# Patient Record
Sex: Female | Born: 1940 | Race: White | Hispanic: No | Smoking: Former smoker
Health system: Southern US, Community
[De-identification: ages and names within clinical notes are randomized; demographics above are authoritative.]

## PROBLEM LIST (undated history)

## (undated) DIAGNOSIS — F419 Anxiety disorder, unspecified: Secondary | ICD-10-CM

## (undated) DIAGNOSIS — J069 Acute upper respiratory infection, unspecified: Secondary | ICD-10-CM

## (undated) DIAGNOSIS — J449 Chronic obstructive pulmonary disease, unspecified: Secondary | ICD-10-CM

## (undated) DIAGNOSIS — R42 Dizziness and giddiness: Secondary | ICD-10-CM

## (undated) DIAGNOSIS — R059 Cough, unspecified: Secondary | ICD-10-CM

## (undated) DIAGNOSIS — E781 Pure hyperglyceridemia: Secondary | ICD-10-CM

## (undated) DIAGNOSIS — I4891 Unspecified atrial fibrillation: Secondary | ICD-10-CM

## (undated) DIAGNOSIS — H35359 Cystoid macular degeneration, unspecified eye: Secondary | ICD-10-CM

## (undated) DIAGNOSIS — R0981 Nasal congestion: Secondary | ICD-10-CM

## (undated) HISTORY — PX: CHOLECYSTECTOMY: SHX55

## (undated) HISTORY — DX: Dizziness and giddiness: R42

## (undated) HISTORY — DX: Pure hyperglyceridemia: E78.1

## (undated) HISTORY — DX: Nasal congestion: R09.81

## (undated) HISTORY — DX: Anxiety disorder, unspecified: F41.9

## (undated) HISTORY — DX: Cough, unspecified: R05.9

## (undated) HISTORY — DX: Acute upper respiratory infection, unspecified: J06.9

## (undated) HISTORY — DX: Cystoid macular degeneration, unspecified eye: H35.359

## (undated) HISTORY — PX: EYE SURGERY: SHX253

## (undated) HISTORY — PX: ABDOMINAL HYSTERECTOMY: SHX81

## (undated) HISTORY — DX: Unspecified atrial fibrillation: I48.91

---

## 2000-03-14 ENCOUNTER — Encounter: Admission: RE | Admit: 2000-03-14 | Discharge: 2000-03-14 | Payer: Self-pay | Admitting: Family Medicine

## 2000-03-14 ENCOUNTER — Encounter: Payer: Self-pay | Admitting: Family Medicine

## 2001-09-15 ENCOUNTER — Encounter: Admission: RE | Admit: 2001-09-15 | Discharge: 2001-09-15 | Payer: Self-pay | Admitting: Family Medicine

## 2001-09-15 ENCOUNTER — Encounter: Payer: Self-pay | Admitting: Family Medicine

## 2002-07-03 ENCOUNTER — Encounter: Admission: RE | Admit: 2002-07-03 | Discharge: 2002-07-03 | Payer: Self-pay | Admitting: Urology

## 2002-07-03 ENCOUNTER — Encounter: Payer: Self-pay | Admitting: Urology

## 2002-09-17 ENCOUNTER — Encounter: Payer: Self-pay | Admitting: Family Medicine

## 2002-09-17 ENCOUNTER — Encounter: Admission: RE | Admit: 2002-09-17 | Discharge: 2002-09-17 | Payer: Self-pay | Admitting: Family Medicine

## 2003-09-19 ENCOUNTER — Encounter: Admission: RE | Admit: 2003-09-19 | Discharge: 2003-09-19 | Payer: Self-pay | Admitting: Family Medicine

## 2005-01-19 ENCOUNTER — Encounter (INDEPENDENT_AMBULATORY_CARE_PROVIDER_SITE_OTHER): Payer: Self-pay | Admitting: Specialist

## 2005-01-20 ENCOUNTER — Ambulatory Visit (HOSPITAL_COMMUNITY): Admission: RE | Admit: 2005-01-20 | Discharge: 2005-01-20 | Payer: Self-pay | Admitting: *Deleted

## 2006-08-24 ENCOUNTER — Encounter: Admission: RE | Admit: 2006-08-24 | Discharge: 2006-08-24 | Payer: Self-pay | Admitting: Family Medicine

## 2007-08-29 ENCOUNTER — Encounter: Admission: RE | Admit: 2007-08-29 | Discharge: 2007-08-29 | Payer: Self-pay | Admitting: Family Medicine

## 2009-03-25 ENCOUNTER — Encounter: Admission: RE | Admit: 2009-03-25 | Discharge: 2009-03-25 | Payer: Self-pay | Admitting: Family Medicine

## 2010-04-22 ENCOUNTER — Encounter: Admission: RE | Admit: 2010-04-22 | Discharge: 2010-04-22 | Payer: Self-pay | Admitting: Family Medicine

## 2011-03-15 ENCOUNTER — Other Ambulatory Visit: Payer: Self-pay | Admitting: Family Medicine

## 2011-03-15 DIAGNOSIS — Z1231 Encounter for screening mammogram for malignant neoplasm of breast: Secondary | ICD-10-CM

## 2011-04-23 ENCOUNTER — Ambulatory Visit
Admission: RE | Admit: 2011-04-23 | Discharge: 2011-04-23 | Disposition: A | Discharge status: Home / Self Care | Payer: Medicare Other | Source: Ambulatory Visit | Attending: Family Medicine | Admitting: Family Medicine

## 2011-04-23 ENCOUNTER — Other Ambulatory Visit: Payer: Self-pay | Admitting: Family Medicine

## 2011-04-23 DIAGNOSIS — Z1231 Encounter for screening mammogram for malignant neoplasm of breast: Secondary | ICD-10-CM

## 2011-09-20 ENCOUNTER — Emergency Department (HOSPITAL_BASED_OUTPATIENT_CLINIC_OR_DEPARTMENT_OTHER)
Admission: EM | Admit: 2011-09-20 | Discharge: 2011-09-20 | Disposition: A | Discharge status: Home / Self Care | Payer: Medicare Other | Attending: Emergency Medicine | Admitting: Emergency Medicine

## 2011-09-20 ENCOUNTER — Other Ambulatory Visit: Payer: Self-pay

## 2011-09-20 ENCOUNTER — Encounter: Payer: Self-pay | Admitting: *Deleted

## 2011-09-20 ENCOUNTER — Emergency Department (INDEPENDENT_AMBULATORY_CARE_PROVIDER_SITE_OTHER): Payer: Medicare Other

## 2011-09-20 DIAGNOSIS — R0602 Shortness of breath: Secondary | ICD-10-CM | POA: Insufficient documentation

## 2011-09-20 DIAGNOSIS — R911 Solitary pulmonary nodule: Secondary | ICD-10-CM

## 2011-09-20 DIAGNOSIS — K219 Gastro-esophageal reflux disease without esophagitis: Secondary | ICD-10-CM | POA: Insufficient documentation

## 2011-09-20 LAB — DIFFERENTIAL
Eosinophils Absolute: 0.1 10*3/uL (ref 0.0–0.7)
Eosinophils Relative: 2 % (ref 0–5)
Lymphocytes Relative: 48 % — ABNORMAL HIGH (ref 12–46)
Lymphs Abs: 3.3 10*3/uL (ref 0.7–4.0)

## 2011-09-20 LAB — CBC
HCT: 37.4 % (ref 36.0–46.0)
Hemoglobin: 12.9 g/dL (ref 12.0–15.0)
MCV: 88 fL (ref 78.0–100.0)
RDW: 13.2 % (ref 11.5–15.5)

## 2011-09-20 LAB — BASIC METABOLIC PANEL
BUN: 16 mg/dL (ref 6–23)
CO2: 28 mEq/L (ref 19–32)
Creatinine, Ser: 0.7 mg/dL (ref 0.50–1.10)
GFR calc Af Amer: >90 mL/min (ref >90)
Sodium: 138 mEq/L (ref 135–145)

## 2011-09-20 LAB — TROPONIN I: Troponin I: <0.3 ng/mL (ref <0.30)

## 2011-09-20 LAB — D-DIMER, QUANTITATIVE: D-Dimer, Quant: 0.58 ug/mL-FEU — ABNORMAL HIGH (ref 0.00–0.48)

## 2011-09-20 MED ORDER — IOHEXOL 350 MG/ML SOLN
80.0000 mL | Freq: Once | INTRAVENOUS | Status: AC | PRN
  Administered 2011-09-20: 80 mL via INTRAVENOUS

## 2011-09-20 MED ORDER — GI COCKTAIL ~~LOC~~
30.0000 mL | Freq: Once | ORAL | Status: AC
  Administered 2011-09-20: 30 mL via ORAL
  Filled 2011-09-20: qty 30

## 2011-09-20 NOTE — Discharge Instructions (Signed)
Diet for GERD or PUD Nutrition therapy can help ease the discomfort of gastroesophageal reflux disease (GERD) and peptic ulcer disease (PUD).  HOME CARE INSTRUCTIONS   Eat your meals slowly, in a relaxed setting.   Eat 5 to 6 small meals per day.   If a food causes distress, stop eating it for a period of time.  FOODS TO AVOID  Coffee, regular or decaffeinated.   Cola beverages, regular or low calorie.   Tea, regular or decaffeinated.   Pepper.   Cocoa.   High fat foods, including meats.   Butter, margarine, hydrogenated oil (trans fats).   Peppermint or spearmint (if you have GERD).   Fruits and vegetables if not tolerated.   Alcohol.   Nicotine (smoking or chewing). This is one of the most potent stimulants to acid production in the gastrointestinal tract.   Any food that seems to aggravate your condition.  If you have questions regarding your diet, ask your caregiver or a registered dietitian. TIPS  Lying flat may make symptoms worse. Keep the head of your bed raised 6 to 9 inches (15 to 23 cm) by using a foam wedge or blocks under the legs of the bed.   Do not lay down until 3 hours after eating a meal.   Daily physical activity may help reduce symptoms.  MAKE SURE YOU:   Understand these instructions.   Will watch your condition.   Will get help right away if you are not doing well or get worse.  Document Released: 09/13/2005 Document Revised: 05/26/2011 Document Reviewed: 01/27/2009 [facility] Patient Information 2012 [place], [place].Gastroesophageal Reflux Disease, Adult Gastroesophageal reflux disease (GERD) happens when acid from your stomach flows up into the esophagus. When acid comes in contact with the esophagus, the acid causes soreness (inflammation) in the esophagus. Over time, GERD may create small holes (ulcers) in the lining of the esophagus. CAUSES   Increased body weight. This puts pressure on the stomach, making acid rise from the stomach into  the esophagus.   Smoking. This increases acid production in the stomach.   Drinking alcohol. This causes decreased pressure in the lower esophageal sphincter (valve or ring of muscle between the esophagus and stomach), allowing acid from the stomach into the esophagus.   Late evening meals and a full stomach. This increases pressure and acid production in the stomach.   A malformed lower esophageal sphincter.  Sometimes, no cause is found. SYMPTOMS   Burning pain in the lower part of the mid-chest behind the breastbone and in the mid-stomach area. This may occur twice a week or more often.   Trouble swallowing.   Sore throat.   Dry cough.   Asthma-like symptoms including chest tightness, shortness of breath, or wheezing.  DIAGNOSIS  Your caregiver may be able to diagnose GERD based on your symptoms. In some cases, X-rays and other tests may be done to check for complications or to check the condition of your stomach and esophagus. TREATMENT  Your caregiver may recommend over-the-counter or prescription medicines to help decrease acid production. Ask your caregiver before starting or adding any new medicines.  HOME CARE INSTRUCTIONS   Change the factors that you can control. Ask your caregiver for guidance concerning weight loss, quitting smoking, and alcohol consumption.   Avoid foods and drinks that make your symptoms worse, such as:   Caffeine or alcoholic drinks.   Chocolate.   Peppermint or mint flavorings.   Garlic and onions.   Spicy foods.  Citrus fruits, such as oranges, lemons, or limes.   Tomato-based foods such as sauce, chili, salsa, and pizza.   Fried and fatty foods.   Avoid lying down for the 3 hours prior to your bedtime or prior to taking a nap.   Eat small, frequent meals instead of large meals.   Wear loose-fitting clothing. Do not wear anything tight around your waist that causes pressure on your stomach.   Raise the head of your bed 6 to 8  inches with wood blocks to help you sleep. Extra pillows will not help.   Only take over-the-counter or prescription medicines for pain, discomfort, or fever as directed by your caregiver.   Do not take aspirin, ibuprofen, or other nonsteroidal anti-inflammatory drugs (NSAIDs).  SEEK IMMEDIATE MEDICAL CARE IF:   You have pain in your arms, neck, jaw, teeth, or back.   Your pain increases or changes in intensity or duration.   You develop nausea, vomiting, or sweating (diaphoresis).   You develop shortness of breath, or you faint.   Your vomit is green, yellow, black, or looks like coffee grounds or blood.   Your stool is red, bloody, or black.  These symptoms could be signs of other problems, such as heart disease, gastric bleeding, or esophageal bleeding. MAKE SURE YOU:   Understand these instructions.   Will watch your condition.   Will get help right away if you are not doing well or get worse.  Document Released: 06/23/2005 Document Revised: 05/26/2011 Document Reviewed: 04/02/2011 [facility] Patient Information 2012 [place], [place].Dyspnea Shortness of breath (dyspnea) is the feeling of uneasy breathing. Dyspnea should be evaluated promptly. DIAGNOSIS  Many tests may be done to find why you are having shortness of breath. Tests may include:  A chest X-ray.   A lung function test.   Blood tests.   Recordings of the electrical activity of the heart (electrocardiogram).   Exercise testing.   Sound wave images of the heart (a cardiac echocardiogram).   A scan.  A cause for your shortness of breath may not be identified initially. In this case, it is important to have a follow-up exam with your caregiver. HOME CARE INSTRUCTIONS   Do not smoke. Smoking is a common cause of shortness of breath. Ask for help to stop smoking.   Avoid being around chemicals that may bother your breathing, such as paint fumes or dust.   Rest as needed. Slowly begin your usual activities.     If medications were prescribed, take them as directed for the full length of time directed. This includes oxygen and any inhaled medications, if prescribed.   It is very important that you follow up with your caregiver or other physician as directed. Waiting to do so or failure to follow up could result in worsening of your condition, possible disability, or death.   Be sure you understand what to do or who to call if your shortness of breath worsens.  SEEK MEDICAL CARE IF:   Your condition does not improve in the time expected.   You have a hard time doing your normal activities even with rest.   You have any side effects from or problems with medications prescribed.  SEEK IMMEDIATE MEDICAL CARE IF:   You feel your shortness of breath is getting worse.   You feel lightheaded, faint or develop a cough not controlled with medications.   You start coughing up blood.   You get pain with breathing.   You get chest pain  or pain in your arms, shoulders or belly (abdomen).   You have a fever.   You are unable to walk up stairs or exercise the way you normally can.  MAKE SURE YOU:   Understand these instructions.   Will watch your condition.   Will get help right away if you are not doing well or get worse.  Document Released: 10/21/2004 Document Revised: 05/26/2011 Document Reviewed: 01/29/2010 [facility] Patient Information 2012 [place], [place].

## 2011-09-20 NOTE — ED Notes (Signed)
Pt reports 2 week hx of SOB states that it has progressively worsened denies chest pain denies lowe rextremity pain or swelling states that she feels like her airway is not large enough

## 2011-09-20 NOTE — ED Notes (Signed)
Pt has no respiratory hx and is very healthy but states that she feels like her airway is to small to breath in. Pt seems to be breathing well and in no respiratory distress. Pt is able to answer questions in complete sentences. Pt states that when she lies down it feels worse and has been going on for a few weeks.

## 2011-09-20 NOTE — ED Provider Notes (Addendum)
History   This chart was scribed for Cyndra Numbers, MD by Melba Coon. The patient was seen in room MH07/MH07 and the patient's care was started at 9:55PM.    CSN: [id number]  Arrival date & time 09/20/11  2127   First MD Initiated Contact with Patient 09/20/11 2146      Chief Complaint  Patient presents with  . Shortness of Breath    (Consider location/radiation/quality/duration/timing/severity/associated sxs/prior treatment) HPI  Nancy Avila is a 70 y.o. female who presents to the Emergency Department complaining of shortness of breath with an onset 3 days ago. Pt states that SOB has been "gradually progressing" for a couple of weeks, but recently it has been at its worst. She has never had SOB before or any respiratory problems. She has no medical problems as well (last physical was 7 months ago). No problems with ingestion, but problems with acid reflux last year. Since then, episodes of reflux have not been common but still are present periodically. Pt has smoked for 40 yrs but stopped 2 months ago. Nothing alleviates the SOB, but exertion aggravates it. She has not recently taken any long trips and is not currently under hormone therapy. No swelling, cough, fever, dark tarry stools, chest pain, or any pain in general.  No PCP  History reviewed. No pertinent past medical history.  Past Surgical History  Procedure Date  . Abdominal hysterectomy   . Cholecystectomy   . Eye surgery    Family history of blood clots (dad, sister)  History  Substance Use Topics  . Smoking status: Never Smoker   . Smokeless tobacco: Not on file  . Alcohol Use: No    OB History    Grav Para Term Preterm Abortions TAB SAB Ect Mult Living                  Review of Systems 10 Systems reviewed and are negative for acute change except as noted in the HPI.  Allergies  Review of patient's allergies indicates no known allergies.  Home Medications   Current Outpatient Rx  Name Route  Sig Dispense Refill  . CALCIUM CARBONATE-VITAMIN D 600-400 MG-UNIT PO TABS Oral Take 1 tablet by mouth 2 (two) times daily.      Marland Kitchen CINNAMON 500 MG PO TABS Oral Take 1 tablet by mouth 2 (two) times daily.      . CYCLOSPORINE 0.05 % OP EMUL Both Eyes Place 1 drop into both eyes 2 (two) times daily.      . OMEGA-3 FATTY ACIDS 1000 MG PO CAPS Oral Take 1 g by mouth 2 (two) times daily.      Marland Kitchen GARLIC 100 MG PO TABS Oral Take 1 tablet by mouth 2 (two) times daily.      Marland Kitchen PHENYLEPHRINE HCL 10 MG PO TABS Oral Take 10 mg by mouth 2 (two) times daily.      . RED YEAST RICE 600 MG PO CAPS Oral Take 1 capsule by mouth 2 (two) times daily.      Marland Kitchen VITAMIN C 500 MG PO TABS Oral Take 500 mg by mouth 2 (two) times daily.      Marland Kitchen ZINC 100 MG PO TABS Oral Take 1 tablet by mouth daily.        BP 138/88  Pulse 74  Temp(Src) 98.2 F (36.8 C) (Oral)  Resp 20  SpO2 99%  Physical Exam  Nursing note and vitals reviewed. Constitutional: She is oriented to person, place, and time. She  appears well-developed and well-nourished. No distress.  HENT:  Head: Normocephalic and atraumatic.  Eyes: Conjunctivae and EOM are normal. Pupils are equal, round, and reactive to light.  Neck: Normal range of motion. No tracheal deviation present.  Cardiovascular: Normal rate, regular rhythm and normal heart sounds.  Exam reveals no gallop and no friction rub.   No murmur heard. Pulmonary/Chest: Effort normal. No respiratory distress. She has no wheezes. She has no rales.       Lungs sounds are clear.  Abdominal: Soft. Bowel sounds are normal. She exhibits no distension. There is no tenderness. There is no rebound and no guarding.  Musculoskeletal: Normal range of motion. She exhibits no edema.  Neurological: She is alert and oriented to person, place, and time. No sensory deficit.  Skin: Skin is warm and dry. No rash noted.  Psychiatric: She has a normal mood and affect. Her behavior is normal.    ED Course  Procedures  (including critical care time)   Date: 09/20/2011  Rate: 73  Rhythm: normal sinus rhythm and sinus arrhythmia  QRS Axis: normal  Intervals: normal  ST/T Wave abnormalities: nonspecific T wave changes  Conduction Disutrbances:incomplete RBBB  Narrative Interpretation:   Old EKG Reviewed: none available   DIAGNOSTIC STUDIES: Oxygen Saturation is 99% on room air, normal by my interpretation.    COORDINATION OF CARE:  10:39PM - imaging results returned; EDMD determined no anemia, possible inhaler treatment 10:46PM - D-dimer is positive; Dr elects for CAT scan   Labs Reviewed  DIFFERENTIAL - Abnormal; Notable for the following:    Neutrophils Relative 37 (*)    Lymphocytes Relative 48 (*)    All other components within normal limits  BASIC METABOLIC PANEL - Abnormal; Notable for the following:    Glucose, Bld 107 (*)    GFR calc non Af Amer 86 (*)    All other components within normal limits  D-DIMER, QUANTITATIVE - Abnormal; Notable for the following:    D-Dimer, Quant 0.58 (*)    All other components within normal limits  CBC  TROPONIN I  PRO B NATRIURETIC PEPTIDE   Dg Chest 2 View  09/20/2011  *RADIOLOGY REPORT*  Clinical Data: Short of breath.  CHEST - 2 VIEW  Comparison: 03/13/2008.  Findings: Hyperinflation.  Chronic bronchitic changes are present at the lung bases.  No airspace disease.  Cardiopericardial silhouette is within normal limits.  Bilateral pleural apical scarring.  Aortic arch atherosclerosis. Bosselation of the right hemidiaphragm.  IMPRESSION: Hyperinflation suggesting emphysema.  Mild basilar chronic bronchitic changes without acute cardiopulmonary disease.  Original Report Authenticated By: Andreas Newport, M.D.     1. Shortness of breath   2. GERD (gastroesophageal reflux disease)       MDM  Patient was evaluated and was hemodynamically stable. Based on her complaints she did have workup for shortness of breath. Chest x-ray showed chronic  bronchitic changes with no acute cardiopulmonary process. EKG showed incomplete right bundle-branch block but was otherwise unremarkable. Troponin, CBC, and renal panel as well as BNP were within normal limits. Patient was given a GI cocktail given recent complaints of reflux and this did not improve her symptoms. She declined albuterol inhaler when this was offered after chest x-ray returned. D-dimer was positive and CT images of the chest was ordered.  This showed a pulmonary nodule as well as a small area concerning for atelectasis versus infarction. I spoke to the radiologist myself and given that patient does not have chest pain this is  likely just a small area of atelectasis. Patient did have improvement and resolution of her symptoms with GI cocktail. We discussed that her symptoms may be related to reflux. Education was provided. Patient preferred not to be started on a medication this evening. She can discuss this with her primary care physician. She was discharged in good condition.  I personally performed the services described in this documentation, which was scribed in my presence. The recorded information has been reviewed and considered.         Cyndra Numbers, MD 09/20/11 4540  Cyndra Numbers, MD 09/20/11 [phone]

## 2012-03-13 ENCOUNTER — Other Ambulatory Visit: Payer: Self-pay | Admitting: Family Medicine

## 2012-03-13 DIAGNOSIS — R911 Solitary pulmonary nodule: Secondary | ICD-10-CM

## 2012-03-16 ENCOUNTER — Ambulatory Visit
Admission: RE | Admit: 2012-03-16 | Discharge: 2012-03-16 | Disposition: A | Discharge status: Home / Self Care | Payer: Medicare Other | Source: Ambulatory Visit | Attending: Family Medicine | Admitting: Family Medicine

## 2012-03-16 DIAGNOSIS — R911 Solitary pulmonary nodule: Secondary | ICD-10-CM

## 2012-03-16 MED ORDER — IOHEXOL 300 MG/ML  SOLN
75.0000 mL | Freq: Once | INTRAMUSCULAR | Status: AC | PRN
  Administered 2012-03-16: 75 mL via INTRAVENOUS

## 2012-05-01 ENCOUNTER — Other Ambulatory Visit: Payer: Self-pay | Admitting: Family Medicine

## 2012-05-01 DIAGNOSIS — Z1231 Encounter for screening mammogram for malignant neoplasm of breast: Secondary | ICD-10-CM

## 2012-05-08 ENCOUNTER — Ambulatory Visit
Admission: RE | Admit: 2012-05-08 | Discharge: 2012-05-08 | Disposition: A | Discharge status: Home / Self Care | Payer: Medicare Other | Source: Ambulatory Visit | Attending: Family Medicine | Admitting: Family Medicine

## 2012-05-08 DIAGNOSIS — Z1231 Encounter for screening mammogram for malignant neoplasm of breast: Secondary | ICD-10-CM

## 2013-04-09 ENCOUNTER — Other Ambulatory Visit: Payer: Self-pay

## 2013-04-09 DIAGNOSIS — Z1231 Encounter for screening mammogram for malignant neoplasm of breast: Secondary | ICD-10-CM

## 2013-05-09 ENCOUNTER — Ambulatory Visit
Admission: RE | Admit: 2013-05-09 | Discharge: 2013-05-09 | Disposition: A | Discharge status: Home / Self Care | Payer: Medicare Other | Source: Ambulatory Visit

## 2013-05-09 DIAGNOSIS — Z1231 Encounter for screening mammogram for malignant neoplasm of breast: Secondary | ICD-10-CM

## 2013-08-02 ENCOUNTER — Emergency Department (HOSPITAL_BASED_OUTPATIENT_CLINIC_OR_DEPARTMENT_OTHER): Payer: Medicare Other

## 2013-08-02 ENCOUNTER — Emergency Department (HOSPITAL_BASED_OUTPATIENT_CLINIC_OR_DEPARTMENT_OTHER)
Admission: EM | Admit: 2013-08-02 | Discharge: 2013-08-02 | Disposition: A | Discharge status: Home / Self Care | Payer: Medicare Other | Attending: Emergency Medicine | Admitting: Emergency Medicine

## 2013-08-02 ENCOUNTER — Encounter (HOSPITAL_BASED_OUTPATIENT_CLINIC_OR_DEPARTMENT_OTHER): Payer: Self-pay | Admitting: Emergency Medicine

## 2013-08-02 DIAGNOSIS — Z79899 Other long term (current) drug therapy: Secondary | ICD-10-CM | POA: Insufficient documentation

## 2013-08-02 DIAGNOSIS — J441 Chronic obstructive pulmonary disease with (acute) exacerbation: Secondary | ICD-10-CM | POA: Insufficient documentation

## 2013-08-02 DIAGNOSIS — H5500 Unspecified nystagmus: Secondary | ICD-10-CM | POA: Insufficient documentation

## 2013-08-02 DIAGNOSIS — R42 Dizziness and giddiness: Secondary | ICD-10-CM | POA: Insufficient documentation

## 2013-08-02 HISTORY — DX: Chronic obstructive pulmonary disease, unspecified: J44.9

## 2013-08-02 LAB — URINALYSIS, ROUTINE W REFLEX MICROSCOPIC
Bilirubin Urine: NEGATIVE
Hgb urine dipstick: NEGATIVE
Ketones, ur: NEGATIVE mg/dL
Protein, ur: NEGATIVE mg/dL
Urobilinogen, UA: 0.2 mg/dL (ref 0.0–1.0)

## 2013-08-02 LAB — CBC
MCH: 30.4 pg (ref 26.0–34.0)
MCHC: 34.1 g/dL (ref 30.0–36.0)
MCV: 89.1 fL (ref 78.0–100.0)
Platelets: 164 10*3/uL (ref 150–400)
RDW: 13.5 % (ref 11.5–15.5)
WBC: 5.6 10*3/uL (ref 4.0–10.5)

## 2013-08-02 LAB — COMPREHENSIVE METABOLIC PANEL
AST: 25 U/L (ref 0–37)
Albumin: 4 g/dL (ref 3.5–5.2)
Calcium: 9.6 mg/dL (ref 8.4–10.5)
Creatinine, Ser: 0.8 mg/dL (ref 0.50–1.10)

## 2013-08-02 LAB — TROPONIN I: Troponin I: <0.3 ng/mL (ref <0.30)

## 2013-08-02 MED ORDER — MECLIZINE HCL 25 MG PO TABS
12.5000 mg | ORAL_TABLET | Freq: Once | ORAL | Status: AC
  Administered 2013-08-02: 12.5 mg via ORAL

## 2013-08-02 MED ORDER — MECLIZINE HCL 12.5 MG PO TABS: 12.5000 mg | ORAL_TABLET | Freq: Three times a day (TID) | ORAL | Status: DC | PRN

## 2013-08-02 MED ORDER — MECLIZINE HCL 25 MG PO TABS
ORAL_TABLET | ORAL | Status: AC
  Filled 2013-08-02: qty 1

## 2013-08-02 NOTE — ED Notes (Signed)
Dizziness onset this am denies pain

## 2013-08-02 NOTE — ED Provider Notes (Signed)
CSN: [id number]     Arrival date & time 08/02/13  1116 History   First MD Initiated Contact with Patient 08/02/13 1126     Chief Complaint  Patient presents with  . Dizziness   (Consider location/radiation/quality/duration/timing/severity/associated sxs/prior Treatment) HPI  72 y/o female here with acute onset dizziness this am at 8:05 (4 hours 10 minutes ago). She states that she was shopping at [place] and her dizziness began. She denies falling, LOC, weakness, numbness, confusion, or difficulty talking. Her dizziness is described as an unsteady feeling, she denies any spinning sensation. She is having stable dyspnea due to her COPD but new R sided chest heaviness. She denies recent illness and has a normal appetite and PO intake.   Past Medical History  Diagnosis Date  . COPD (chronic obstructive pulmonary disease)    Past Surgical History  Procedure Laterality Date  . Abdominal hysterectomy    . Cholecystectomy    . Eye surgery     History reviewed. No pertinent family history. History  Substance Use Topics  . Smoking status: Never Smoker   . Smokeless tobacco: Not on file  . Alcohol Use: No   OB History   Grav Para Term Preterm Abortions TAB SAB Ect Mult Living                 Review of Systems  Constitutional: Negative for fever, chills, activity change and appetite change.  HENT: Negative for congestion and sore throat.   Eyes: Negative for visual disturbance.  Respiratory: Positive for shortness of breath. Negative for chest tightness.        R sided Chest heaviness  Gastrointestinal: Negative for nausea, vomiting, abdominal pain, diarrhea and constipation.  Genitourinary: Negative for dysuria and flank pain.  Musculoskeletal: Negative for back pain.  Neurological: Positive for dizziness. Negative for seizures, syncope, facial asymmetry, weakness, numbness and headaches.    Allergies  Review of patient's allergies indicates no known allergies.  Home Medications    Current Outpatient Rx  Name  Route  Sig  Dispense  Refill  . Calcium Carbonate-Vitamin D (CALTRATE 600+D) 600-400 MG-UNIT per tablet   Oral   Take 1 tablet by mouth 2 (two) times daily.           . Cinnamon 500 MG TABS   Oral   Take 1 tablet by mouth 2 (two) times daily.           . cycloSPORINE (RESTASIS) 0.05 % ophthalmic emulsion   Both Eyes   Place 1 drop into both eyes 2 (two) times daily.           . fish oil-omega-3 fatty acids 1000 MG capsule   Oral   Take 1 g by mouth 2 (two) times daily.           . Garlic 100 MG TABS   Oral   Take 1 tablet by mouth 2 (two) times daily.           . meclizine (ANTIVERT) 12.5 MG tablet   Oral   Take 1 tablet (12.5 mg total) by mouth 3 (three) times daily as needed for dizziness.   30 tablet   0   . phenylephrine (SUDAFED PE) 10 MG TABS   Oral   Take 10 mg by mouth 2 (two) times daily.           . Red Yeast Rice 600 MG CAPS   Oral   Take 1 capsule by mouth 2 (two) times daily.           Marland Kitchen  vitamin C (ASCORBIC ACID) 500 MG tablet   Oral   Take 500 mg by mouth 2 (two) times daily.           . Zinc 100 MG TABS   Oral   Take 1 tablet by mouth daily.            BP 124/85  Pulse 68  Temp(Src) 98.5 F (36.9 C) (Oral)  Resp 16  Ht 5' 8.5" (1.74 m)  Wt 165 lb (74.844 kg)  BMI 24.72 kg/m2  SpO2 96% Physical Exam  Constitutional: She is oriented to person, place, and time. She appears well-developed and well-nourished. No distress.  HENT:  Head: Normocephalic and atraumatic.  Right Ear: External ear normal.  Left Ear: Tympanic membrane and external ear normal.  R Tm with tear drop shaped clearing of TM  Eyes: Right eye exhibits nystagmus. Left eye exhibits no nystagmus.  Neck: Neck supple.  Cardiovascular: Normal rate, regular rhythm and normal heart sounds.   No murmur heard. Pulmonary/Chest: Effort normal and breath sounds normal. She has no wheezes.  Abdominal: Soft. Bowel sounds are normal. There  is no tenderness.  Musculoskeletal: She exhibits no edema.  Neurological: She is alert and oriented to person, place, and time. She has normal strength. No cranial nerve deficit or sensory deficit.  Positive romberg Few beats of R beating nystagmus   Skin: Skin is warm and dry.  Psychiatric: She has a normal mood and affect.    ED Course  Procedures (including critical care time) Labs Review Labs Reviewed  URINALYSIS, ROUTINE W REFLEX MICROSCOPIC - Abnormal; Notable for the following:    Specific Gravity, Urine 1.003 (*)    All other components within normal limits  COMPREHENSIVE METABOLIC PANEL - Abnormal; Notable for the following:    Glucose, Bld 104 (*)    GFR calc non Af Amer 73 (*)    GFR calc Af Amer 85 (*)    All other components within normal limits  CBC  TROPONIN I   Imaging Review Dg Chest 2 View  08/02/2013   CLINICAL DATA:  Dizziness. COPD. History of cholecystectomy.  EXAM: CHEST  2 VIEW  COMPARISON:  03/16/2012.  FINDINGS: Stable appearance of the chest. Cardiopericardial silhouette is within normal limits. Bosselation of the right hemidiaphragm. Mild hyperinflation compatible with emphysema. The pulmonary nodule seen on prior chest CT are below the limits of radiographic resolution. No mass lesion is identified. No airspace disease or pleural effusion. Aortic arch atherosclerosis.  IMPRESSION: No interval change or acute abnormality. Hyperinflation consistent with emphysema.   Electronically Signed   By: Andreas Newport M.D.   On: 08/02/2013 13:12    EKG Interpretation     Ventricular Rate:  70 PR Interval:  188 QRS Duration: 92 QT Interval:  408 QTC Calculation: 440 R Axis:   65 Text Interpretation:  Normal sinus rhythm Normal ECG No significant change was found            MDM   1. Vertigo    Dizziness consistent with peripheral vertigo, improvement with meclizine, able to walk with minimal symptoms prior to dc Unlikely CVA with normal neuro  exam.  UA, CMP, and CBC WNL CXR hyperinflated cw CO/PD Troponin Negative and EKG without ischemic changes  Will DC home, Advise close PCP f/u. Return for worsening symptoms  Murtis Sink, MD [facility] Family Medicine Resident, PGY-2 08/02/2013, 3:43 PM       Elenora Gamma, MD 08/02/13 [phone]

## 2013-08-02 NOTE — ED Provider Notes (Signed)
I saw and evaluated the patient, reviewed the resident's note and I agree with the findings and plan.  EKG Interpretation     Ventricular Rate:  70 PR Interval:  188 QRS Duration: 92 QT Interval:  408 QTC Calculation: 440 R Axis:   65 Text Interpretation:  Normal sinus rhythm Normal ECG No significant change was found           72 yo female with sudden onset dizziness.  Symptoms and exam consistent with peripheral vertigo.  On exam, CN II-XII intact, no nystagmus, normal coordination, no truncal ataxia, normal gait without ataxia, normal strength and sensation.  No signs or symptoms concerning for central vertigo.    Clinical Impression: 1. Vertigo       Candyce Churn, MD 08/02/13 1534

## 2013-08-03 NOTE — ED Provider Notes (Signed)
I saw and evaluated the patient, reviewed the resident's note and I agree with the findings and plan.  EKG Interpretation     Ventricular Rate:  70 PR Interval:  188 QRS Duration: 92 QT Interval:  408 QTC Calculation: 440 R Axis:   65 Text Interpretation:  Normal sinus rhythm Normal ECG No significant change was found              Candyce Churn, MD 08/03/13 [phone]

## 2013-09-21 ENCOUNTER — Other Ambulatory Visit: Payer: Self-pay | Admitting: Family Medicine

## 2013-09-21 DIAGNOSIS — R911 Solitary pulmonary nodule: Secondary | ICD-10-CM

## 2013-09-25 ENCOUNTER — Ambulatory Visit
Admission: RE | Admit: 2013-09-25 | Discharge: 2013-09-25 | Disposition: A | Discharge status: Home / Self Care | Payer: Medicare Other | Source: Ambulatory Visit | Attending: Family Medicine | Admitting: Family Medicine

## 2013-09-25 DIAGNOSIS — R911 Solitary pulmonary nodule: Secondary | ICD-10-CM

## 2013-09-25 MED ORDER — IOHEXOL 300 MG/ML  SOLN
75.0000 mL | Freq: Once | INTRAMUSCULAR | Status: AC | PRN
  Administered 2013-09-25: 75 mL via INTRAVENOUS

## 2014-04-15 ENCOUNTER — Other Ambulatory Visit: Payer: Self-pay

## 2014-04-15 DIAGNOSIS — Z1231 Encounter for screening mammogram for malignant neoplasm of breast: Secondary | ICD-10-CM

## 2014-05-10 ENCOUNTER — Encounter (INDEPENDENT_AMBULATORY_CARE_PROVIDER_SITE_OTHER): Payer: Self-pay

## 2014-05-10 ENCOUNTER — Ambulatory Visit
Admission: RE | Admit: 2014-05-10 | Discharge: 2014-05-10 | Disposition: A | Discharge status: Home / Self Care | Payer: Medicare HMO | Source: Ambulatory Visit

## 2014-05-10 DIAGNOSIS — Z1231 Encounter for screening mammogram for malignant neoplasm of breast: Secondary | ICD-10-CM

## 2015-04-07 ENCOUNTER — Other Ambulatory Visit: Payer: Self-pay

## 2015-04-07 DIAGNOSIS — Z1231 Encounter for screening mammogram for malignant neoplasm of breast: Secondary | ICD-10-CM

## 2015-05-12 ENCOUNTER — Ambulatory Visit
Admission: RE | Admit: 2015-05-12 | Discharge: 2015-05-12 | Disposition: A | Discharge status: Home / Self Care | Payer: Medicare HMO | Source: Ambulatory Visit

## 2015-05-12 DIAGNOSIS — Z1231 Encounter for screening mammogram for malignant neoplasm of breast: Secondary | ICD-10-CM

## 2015-10-30 DIAGNOSIS — L309 Dermatitis, unspecified: Secondary | ICD-10-CM | POA: Diagnosis not present

## 2015-12-01 DIAGNOSIS — F419 Anxiety disorder, unspecified: Secondary | ICD-10-CM | POA: Insufficient documentation

## 2015-12-01 DIAGNOSIS — R911 Solitary pulmonary nodule: Secondary | ICD-10-CM | POA: Insufficient documentation

## 2015-12-01 DIAGNOSIS — E785 Hyperlipidemia, unspecified: Secondary | ICD-10-CM | POA: Insufficient documentation

## 2015-12-01 DIAGNOSIS — K219 Gastro-esophageal reflux disease without esophagitis: Secondary | ICD-10-CM | POA: Insufficient documentation

## 2015-12-01 DIAGNOSIS — L03115 Cellulitis of right lower limb: Secondary | ICD-10-CM | POA: Diagnosis not present

## 2015-12-15 DIAGNOSIS — L03115 Cellulitis of right lower limb: Secondary | ICD-10-CM | POA: Diagnosis not present

## 2016-04-14 ENCOUNTER — Other Ambulatory Visit: Payer: Self-pay | Admitting: Family Medicine

## 2016-04-14 DIAGNOSIS — Z1231 Encounter for screening mammogram for malignant neoplasm of breast: Secondary | ICD-10-CM

## 2016-05-13 ENCOUNTER — Ambulatory Visit
Admission: RE | Admit: 2016-05-13 | Discharge: 2016-05-13 | Disposition: A | Discharge status: Home / Self Care | Payer: PPO | Source: Ambulatory Visit | Attending: Family Medicine | Admitting: Family Medicine

## 2016-05-13 DIAGNOSIS — Z1231 Encounter for screening mammogram for malignant neoplasm of breast: Secondary | ICD-10-CM | POA: Diagnosis not present

## 2016-09-16 DIAGNOSIS — R05 Cough: Secondary | ICD-10-CM | POA: Diagnosis not present

## 2016-09-16 DIAGNOSIS — J019 Acute sinusitis, unspecified: Secondary | ICD-10-CM | POA: Diagnosis not present

## 2016-09-24 DIAGNOSIS — J40 Bronchitis, not specified as acute or chronic: Secondary | ICD-10-CM | POA: Diagnosis not present

## 2016-09-24 DIAGNOSIS — K1120 Sialoadenitis, unspecified: Secondary | ICD-10-CM | POA: Diagnosis not present

## 2017-04-01 ENCOUNTER — Other Ambulatory Visit: Payer: Self-pay | Admitting: Family Medicine

## 2017-04-01 DIAGNOSIS — Z1231 Encounter for screening mammogram for malignant neoplasm of breast: Secondary | ICD-10-CM

## 2017-05-16 ENCOUNTER — Ambulatory Visit
Admission: RE | Admit: 2017-05-16 | Discharge: 2017-05-16 | Disposition: A | Discharge status: Home / Self Care | Payer: PPO | Source: Ambulatory Visit | Attending: Family Medicine | Admitting: Family Medicine

## 2017-05-16 DIAGNOSIS — Z1231 Encounter for screening mammogram for malignant neoplasm of breast: Secondary | ICD-10-CM

## 2017-08-30 DIAGNOSIS — M9905 Segmental and somatic dysfunction of pelvic region: Secondary | ICD-10-CM | POA: Diagnosis not present

## 2017-08-30 DIAGNOSIS — M9904 Segmental and somatic dysfunction of sacral region: Secondary | ICD-10-CM | POA: Diagnosis not present

## 2017-08-30 DIAGNOSIS — M25551 Pain in right hip: Secondary | ICD-10-CM | POA: Diagnosis not present

## 2017-08-30 DIAGNOSIS — M62830 Muscle spasm of back: Secondary | ICD-10-CM | POA: Diagnosis not present

## 2017-08-30 DIAGNOSIS — M9903 Segmental and somatic dysfunction of lumbar region: Secondary | ICD-10-CM | POA: Diagnosis not present

## 2017-09-01 DIAGNOSIS — M25551 Pain in right hip: Secondary | ICD-10-CM | POA: Diagnosis not present

## 2017-09-01 DIAGNOSIS — M9903 Segmental and somatic dysfunction of lumbar region: Secondary | ICD-10-CM | POA: Diagnosis not present

## 2017-09-01 DIAGNOSIS — M62830 Muscle spasm of back: Secondary | ICD-10-CM | POA: Diagnosis not present

## 2017-09-01 DIAGNOSIS — M9905 Segmental and somatic dysfunction of pelvic region: Secondary | ICD-10-CM | POA: Diagnosis not present

## 2017-09-01 DIAGNOSIS — M9904 Segmental and somatic dysfunction of sacral region: Secondary | ICD-10-CM | POA: Diagnosis not present

## 2017-09-08 DIAGNOSIS — M62830 Muscle spasm of back: Secondary | ICD-10-CM | POA: Diagnosis not present

## 2017-09-08 DIAGNOSIS — M25551 Pain in right hip: Secondary | ICD-10-CM | POA: Diagnosis not present

## 2017-09-08 DIAGNOSIS — M9905 Segmental and somatic dysfunction of pelvic region: Secondary | ICD-10-CM | POA: Diagnosis not present

## 2017-09-08 DIAGNOSIS — M9903 Segmental and somatic dysfunction of lumbar region: Secondary | ICD-10-CM | POA: Diagnosis not present

## 2017-09-08 DIAGNOSIS — M9904 Segmental and somatic dysfunction of sacral region: Secondary | ICD-10-CM | POA: Diagnosis not present

## 2017-09-14 DIAGNOSIS — M25551 Pain in right hip: Secondary | ICD-10-CM | POA: Diagnosis not present

## 2017-09-14 DIAGNOSIS — M9905 Segmental and somatic dysfunction of pelvic region: Secondary | ICD-10-CM | POA: Diagnosis not present

## 2017-09-14 DIAGNOSIS — M62830 Muscle spasm of back: Secondary | ICD-10-CM | POA: Diagnosis not present

## 2017-09-14 DIAGNOSIS — M9903 Segmental and somatic dysfunction of lumbar region: Secondary | ICD-10-CM | POA: Diagnosis not present

## 2017-09-14 DIAGNOSIS — M9904 Segmental and somatic dysfunction of sacral region: Secondary | ICD-10-CM | POA: Diagnosis not present

## 2017-09-29 DIAGNOSIS — M25551 Pain in right hip: Secondary | ICD-10-CM | POA: Diagnosis not present

## 2017-09-29 DIAGNOSIS — M62830 Muscle spasm of back: Secondary | ICD-10-CM | POA: Diagnosis not present

## 2017-09-29 DIAGNOSIS — M9904 Segmental and somatic dysfunction of sacral region: Secondary | ICD-10-CM | POA: Diagnosis not present

## 2017-09-29 DIAGNOSIS — M9905 Segmental and somatic dysfunction of pelvic region: Secondary | ICD-10-CM | POA: Diagnosis not present

## 2017-09-29 DIAGNOSIS — M9903 Segmental and somatic dysfunction of lumbar region: Secondary | ICD-10-CM | POA: Diagnosis not present

## 2017-10-05 DIAGNOSIS — M9903 Segmental and somatic dysfunction of lumbar region: Secondary | ICD-10-CM | POA: Diagnosis not present

## 2017-10-05 DIAGNOSIS — M25551 Pain in right hip: Secondary | ICD-10-CM | POA: Diagnosis not present

## 2017-10-05 DIAGNOSIS — M9904 Segmental and somatic dysfunction of sacral region: Secondary | ICD-10-CM | POA: Diagnosis not present

## 2017-10-05 DIAGNOSIS — M62830 Muscle spasm of back: Secondary | ICD-10-CM | POA: Diagnosis not present

## 2017-10-05 DIAGNOSIS — M9905 Segmental and somatic dysfunction of pelvic region: Secondary | ICD-10-CM | POA: Diagnosis not present

## 2017-10-12 DIAGNOSIS — M5417 Radiculopathy, lumbosacral region: Secondary | ICD-10-CM | POA: Diagnosis not present

## 2017-10-12 DIAGNOSIS — M9903 Segmental and somatic dysfunction of lumbar region: Secondary | ICD-10-CM | POA: Diagnosis not present

## 2017-10-12 DIAGNOSIS — M5136 Other intervertebral disc degeneration, lumbar region: Secondary | ICD-10-CM | POA: Diagnosis not present

## 2017-10-12 DIAGNOSIS — M9905 Segmental and somatic dysfunction of pelvic region: Secondary | ICD-10-CM | POA: Diagnosis not present

## 2017-10-19 DIAGNOSIS — M9903 Segmental and somatic dysfunction of lumbar region: Secondary | ICD-10-CM | POA: Diagnosis not present

## 2017-10-19 DIAGNOSIS — M5417 Radiculopathy, lumbosacral region: Secondary | ICD-10-CM | POA: Diagnosis not present

## 2017-10-19 DIAGNOSIS — M5136 Other intervertebral disc degeneration, lumbar region: Secondary | ICD-10-CM | POA: Diagnosis not present

## 2017-10-19 DIAGNOSIS — M9905 Segmental and somatic dysfunction of pelvic region: Secondary | ICD-10-CM | POA: Diagnosis not present

## 2017-11-01 DIAGNOSIS — M9903 Segmental and somatic dysfunction of lumbar region: Secondary | ICD-10-CM | POA: Diagnosis not present

## 2017-11-01 DIAGNOSIS — M5417 Radiculopathy, lumbosacral region: Secondary | ICD-10-CM | POA: Diagnosis not present

## 2017-11-01 DIAGNOSIS — M5136 Other intervertebral disc degeneration, lumbar region: Secondary | ICD-10-CM | POA: Diagnosis not present

## 2017-11-01 DIAGNOSIS — M9905 Segmental and somatic dysfunction of pelvic region: Secondary | ICD-10-CM | POA: Diagnosis not present

## 2017-12-07 DIAGNOSIS — M9903 Segmental and somatic dysfunction of lumbar region: Secondary | ICD-10-CM | POA: Diagnosis not present

## 2017-12-07 DIAGNOSIS — M5136 Other intervertebral disc degeneration, lumbar region: Secondary | ICD-10-CM | POA: Diagnosis not present

## 2017-12-07 DIAGNOSIS — M5417 Radiculopathy, lumbosacral region: Secondary | ICD-10-CM | POA: Diagnosis not present

## 2017-12-07 DIAGNOSIS — M9905 Segmental and somatic dysfunction of pelvic region: Secondary | ICD-10-CM | POA: Diagnosis not present

## 2018-01-11 DIAGNOSIS — M5417 Radiculopathy, lumbosacral region: Secondary | ICD-10-CM | POA: Diagnosis not present

## 2018-01-11 DIAGNOSIS — M9903 Segmental and somatic dysfunction of lumbar region: Secondary | ICD-10-CM | POA: Diagnosis not present

## 2018-01-11 DIAGNOSIS — M9905 Segmental and somatic dysfunction of pelvic region: Secondary | ICD-10-CM | POA: Diagnosis not present

## 2018-01-11 DIAGNOSIS — M5136 Other intervertebral disc degeneration, lumbar region: Secondary | ICD-10-CM | POA: Diagnosis not present

## 2018-02-08 DIAGNOSIS — M9903 Segmental and somatic dysfunction of lumbar region: Secondary | ICD-10-CM | POA: Diagnosis not present

## 2018-02-08 DIAGNOSIS — M5417 Radiculopathy, lumbosacral region: Secondary | ICD-10-CM | POA: Diagnosis not present

## 2018-02-08 DIAGNOSIS — M5136 Other intervertebral disc degeneration, lumbar region: Secondary | ICD-10-CM | POA: Diagnosis not present

## 2018-02-08 DIAGNOSIS — M9905 Segmental and somatic dysfunction of pelvic region: Secondary | ICD-10-CM | POA: Diagnosis not present

## 2018-04-25 DIAGNOSIS — Z0000 Encounter for general adult medical examination without abnormal findings: Secondary | ICD-10-CM | POA: Diagnosis not present

## 2018-04-25 DIAGNOSIS — J449 Chronic obstructive pulmonary disease, unspecified: Secondary | ICD-10-CM | POA: Diagnosis not present

## 2018-04-25 DIAGNOSIS — Z13220 Encounter for screening for lipoid disorders: Secondary | ICD-10-CM | POA: Diagnosis not present

## 2018-04-25 DIAGNOSIS — R42 Dizziness and giddiness: Secondary | ICD-10-CM | POA: Diagnosis not present

## 2018-04-25 DIAGNOSIS — K219 Gastro-esophageal reflux disease without esophagitis: Secondary | ICD-10-CM | POA: Diagnosis not present

## 2018-04-26 DIAGNOSIS — Z13220 Encounter for screening for lipoid disorders: Secondary | ICD-10-CM | POA: Diagnosis not present

## 2018-04-26 DIAGNOSIS — E785 Hyperlipidemia, unspecified: Secondary | ICD-10-CM | POA: Diagnosis not present

## 2018-04-26 DIAGNOSIS — R42 Dizziness and giddiness: Secondary | ICD-10-CM | POA: Diagnosis not present

## 2018-05-02 DIAGNOSIS — Z1211 Encounter for screening for malignant neoplasm of colon: Secondary | ICD-10-CM | POA: Diagnosis not present

## 2018-05-02 DIAGNOSIS — Z1212 Encounter for screening for malignant neoplasm of rectum: Secondary | ICD-10-CM | POA: Diagnosis not present

## 2018-05-10 ENCOUNTER — Other Ambulatory Visit: Payer: Self-pay | Admitting: Family Medicine

## 2018-05-10 DIAGNOSIS — Z1231 Encounter for screening mammogram for malignant neoplasm of breast: Secondary | ICD-10-CM

## 2018-06-06 ENCOUNTER — Ambulatory Visit
Admission: RE | Admit: 2018-06-06 | Discharge: 2018-06-06 | Disposition: A | Discharge status: Home / Self Care | Payer: PPO | Source: Ambulatory Visit | Attending: Family Medicine | Admitting: Family Medicine

## 2018-06-06 DIAGNOSIS — Z1231 Encounter for screening mammogram for malignant neoplasm of breast: Secondary | ICD-10-CM

## 2019-04-23 DIAGNOSIS — M9903 Segmental and somatic dysfunction of lumbar region: Secondary | ICD-10-CM | POA: Diagnosis not present

## 2019-04-23 DIAGNOSIS — M5417 Radiculopathy, lumbosacral region: Secondary | ICD-10-CM | POA: Diagnosis not present

## 2019-04-23 DIAGNOSIS — M9905 Segmental and somatic dysfunction of pelvic region: Secondary | ICD-10-CM | POA: Diagnosis not present

## 2019-04-23 DIAGNOSIS — M5136 Other intervertebral disc degeneration, lumbar region: Secondary | ICD-10-CM | POA: Diagnosis not present

## 2019-05-07 DIAGNOSIS — M47816 Spondylosis without myelopathy or radiculopathy, lumbar region: Secondary | ICD-10-CM | POA: Diagnosis not present

## 2019-05-29 DIAGNOSIS — M9903 Segmental and somatic dysfunction of lumbar region: Secondary | ICD-10-CM | POA: Diagnosis not present

## 2019-05-29 DIAGNOSIS — M9905 Segmental and somatic dysfunction of pelvic region: Secondary | ICD-10-CM | POA: Diagnosis not present

## 2019-05-29 DIAGNOSIS — M5136 Other intervertebral disc degeneration, lumbar region: Secondary | ICD-10-CM | POA: Diagnosis not present

## 2019-05-29 DIAGNOSIS — Q72812 Congenital shortening of left lower limb: Secondary | ICD-10-CM | POA: Diagnosis not present

## 2019-08-08 DIAGNOSIS — R062 Wheezing: Secondary | ICD-10-CM | POA: Diagnosis not present

## 2019-08-08 DIAGNOSIS — U071 COVID-19: Secondary | ICD-10-CM | POA: Diagnosis not present

## 2019-08-08 DIAGNOSIS — J441 Chronic obstructive pulmonary disease with (acute) exacerbation: Secondary | ICD-10-CM | POA: Diagnosis not present

## 2019-08-15 DIAGNOSIS — R0989 Other specified symptoms and signs involving the circulatory and respiratory systems: Secondary | ICD-10-CM | POA: Diagnosis not present

## 2019-08-15 DIAGNOSIS — R0902 Hypoxemia: Secondary | ICD-10-CM | POA: Diagnosis not present

## 2019-08-15 DIAGNOSIS — J1289 Other viral pneumonia: Secondary | ICD-10-CM | POA: Diagnosis not present

## 2019-08-15 DIAGNOSIS — U071 COVID-19: Secondary | ICD-10-CM | POA: Diagnosis not present

## 2019-08-27 ENCOUNTER — Other Ambulatory Visit: Payer: Self-pay | Admitting: Family Medicine

## 2019-08-27 DIAGNOSIS — R0989 Other specified symptoms and signs involving the circulatory and respiratory systems: Secondary | ICD-10-CM

## 2019-09-04 ENCOUNTER — Ambulatory Visit
Admission: RE | Admit: 2019-09-04 | Discharge: 2019-09-04 | Disposition: A | Discharge status: Home / Self Care | Payer: PPO | Source: Ambulatory Visit | Attending: Family Medicine | Admitting: Family Medicine

## 2019-09-04 DIAGNOSIS — R0602 Shortness of breath: Secondary | ICD-10-CM | POA: Diagnosis not present

## 2019-09-04 DIAGNOSIS — R0989 Other specified symptoms and signs involving the circulatory and respiratory systems: Secondary | ICD-10-CM

## 2019-09-04 MED ORDER — IOPAMIDOL (ISOVUE-370) INJECTION 76%
75.0000 mL | Freq: Once | INTRAVENOUS | Status: AC | PRN
  Administered 2019-09-04: 10:00:00 75 mL via INTRAVENOUS

## 2020-02-19 DIAGNOSIS — D485 Neoplasm of uncertain behavior of skin: Secondary | ICD-10-CM | POA: Diagnosis not present

## 2020-02-19 DIAGNOSIS — Z85828 Personal history of other malignant neoplasm of skin: Secondary | ICD-10-CM | POA: Diagnosis not present

## 2020-02-19 DIAGNOSIS — L720 Epidermal cyst: Secondary | ICD-10-CM | POA: Diagnosis not present

## 2020-02-19 DIAGNOSIS — L570 Actinic keratosis: Secondary | ICD-10-CM | POA: Diagnosis not present

## 2020-02-26 DIAGNOSIS — C44319 Basal cell carcinoma of skin of other parts of face: Secondary | ICD-10-CM | POA: Diagnosis not present

## 2020-02-26 DIAGNOSIS — D485 Neoplasm of uncertain behavior of skin: Secondary | ICD-10-CM | POA: Diagnosis not present

## 2020-02-26 DIAGNOSIS — Z85828 Personal history of other malignant neoplasm of skin: Secondary | ICD-10-CM | POA: Diagnosis not present

## 2020-03-14 DIAGNOSIS — R05 Cough: Secondary | ICD-10-CM | POA: Diagnosis not present

## 2020-03-14 DIAGNOSIS — J441 Chronic obstructive pulmonary disease with (acute) exacerbation: Secondary | ICD-10-CM | POA: Diagnosis not present

## 2020-03-14 DIAGNOSIS — J449 Chronic obstructive pulmonary disease, unspecified: Secondary | ICD-10-CM | POA: Diagnosis not present

## 2020-03-19 DIAGNOSIS — C44319 Basal cell carcinoma of skin of other parts of face: Secondary | ICD-10-CM | POA: Diagnosis not present

## 2020-03-19 DIAGNOSIS — Z85828 Personal history of other malignant neoplasm of skin: Secondary | ICD-10-CM | POA: Diagnosis not present

## 2020-04-21 DIAGNOSIS — Z0000 Encounter for general adult medical examination without abnormal findings: Secondary | ICD-10-CM | POA: Diagnosis not present

## 2020-04-21 DIAGNOSIS — R200 Anesthesia of skin: Secondary | ICD-10-CM | POA: Diagnosis not present

## 2020-04-21 DIAGNOSIS — R202 Paresthesia of skin: Secondary | ICD-10-CM | POA: Diagnosis not present

## 2020-04-21 DIAGNOSIS — J449 Chronic obstructive pulmonary disease, unspecified: Secondary | ICD-10-CM | POA: Diagnosis not present

## 2020-04-21 DIAGNOSIS — F419 Anxiety disorder, unspecified: Secondary | ICD-10-CM | POA: Diagnosis not present

## 2020-04-21 DIAGNOSIS — R252 Cramp and spasm: Secondary | ICD-10-CM | POA: Diagnosis not present

## 2020-06-09 DIAGNOSIS — H3581 Retinal edema: Secondary | ICD-10-CM | POA: Diagnosis not present

## 2020-06-09 DIAGNOSIS — Z961 Presence of intraocular lens: Secondary | ICD-10-CM | POA: Diagnosis not present

## 2020-06-09 DIAGNOSIS — H524 Presbyopia: Secondary | ICD-10-CM | POA: Diagnosis not present

## 2020-06-09 DIAGNOSIS — H52203 Unspecified astigmatism, bilateral: Secondary | ICD-10-CM | POA: Diagnosis not present

## 2020-06-11 ENCOUNTER — Ambulatory Visit (INDEPENDENT_AMBULATORY_CARE_PROVIDER_SITE_OTHER): Payer: PPO | Admitting: Ophthalmology

## 2020-06-11 ENCOUNTER — Other Ambulatory Visit: Payer: Self-pay

## 2020-06-11 ENCOUNTER — Encounter (INDEPENDENT_AMBULATORY_CARE_PROVIDER_SITE_OTHER): Payer: Self-pay | Admitting: Ophthalmology

## 2020-06-11 DIAGNOSIS — H35352 Cystoid macular degeneration, left eye: Secondary | ICD-10-CM | POA: Insufficient documentation

## 2020-06-11 MED ORDER — BROMFENAC SODIUM (ONCE-DAILY) 0.09 % OP SOLN: 1.0000 [drp] | Freq: Every morning | OPHTHALMIC | 6 refills | Status: DC

## 2020-06-11 NOTE — Patient Instructions (Signed)
Today asked to report any new onset of visual acuity distortion or decline in either eye

## 2020-06-11 NOTE — Progress Notes (Signed)
06/11/2020     CHIEF COMPLAINT Patient presents for Retina Evaluation   HISTORY OF PRESENT ILLNESS: Nancy Avila is a 79 y.o. female who presents to the clinic today for:   HPI    Retina Evaluation    In left eye.  This started 1 year ago.  Context:  distance vision, mid-range vision and near vision.  Treatments tried include no treatments.          Comments    NP exam for mac edema OS - Ref'd by Gershon Crane - last seen 2012  Pt c/o decreased VA OU x 1 year gradually. No other new symptoms reported OU.       Last edited by Rockie Neighbours, [place] on 06/11/2020  8:25 AM. (History)      Referring physician: Aletha Halim., PA-C [address]  [place] [address]  HISTORICAL INFORMATION:   Selected notes from the MEDICAL RECORD NUMBER       CURRENT MEDICATIONS: Current Outpatient Medications (Ophthalmic Drugs)  Medication Sig  . Bromfenac Sodium 0.09 % SOLN Place 1 drop into the left eye every morning.  . cycloSPORINE (RESTASIS) 0.05 % ophthalmic emulsion Place 1 drop into both eyes 2 (two) times daily.     No current facility-administered medications for this visit. (Ophthalmic Drugs)   Current Outpatient Medications (Other)  Medication Sig  . Calcium Carbonate-Vitamin D (CALTRATE 600+D) 600-400 MG-UNIT per tablet Take 1 tablet by mouth 2 (two) times daily.    . Cinnamon 500 MG TABS Take 1 tablet by mouth 2 (two) times daily.    . fish oil-omega-3 fatty acids 1000 MG capsule Take 1 g by mouth 2 (two) times daily.    . Garlic 945 MG TABS Take 1 tablet by mouth 2 (two) times daily.    . meclizine (ANTIVERT) 12.5 MG tablet Take 1 tablet (12.5 mg total) by mouth 3 (three) times daily as needed for dizziness.  . phenylephrine (SUDAFED PE) 10 MG TABS Take 10 mg by mouth 2 (two) times daily.    . Red Yeast Rice 600 MG CAPS Take 1 capsule by mouth 2 (two) times daily.    . vitamin C (ASCORBIC ACID) 500 MG tablet Take 500 mg by mouth 2 (two) times daily.    . Zinc  100 MG TABS Take 1 tablet by mouth daily.     No current facility-administered medications for this visit. (Other)      REVIEW OF SYSTEMS:    ALLERGIES No Known Allergies  PAST MEDICAL HISTORY Past Medical History:  Diagnosis Date  . COPD (chronic obstructive pulmonary disease) ([place])    Past Surgical History:  Procedure Laterality Date  . ABDOMINAL HYSTERECTOMY    . CHOLECYSTECTOMY    . EYE SURGERY      FAMILY HISTORY Family History  Problem Relation Age of Onset  . Breast cancer Neg Hx     SOCIAL HISTORY Social History   Tobacco Use  . Smoking status: Never Smoker  . Smokeless tobacco: Never Used  Substance Use Topics  . Alcohol use: No  . Drug use: No         OPHTHALMIC EXAM:  Base Eye Exam    Visual Acuity (ETDRS)      Right Left   Dist cc 20/25 -3 20/40   Dist ph cc NI NI   Correction: Glasses       Tonometry (Tonopen, 8:21 AM)      Right Left   Pressure 11  12       Pupils      Pupils Dark Light Shape React APD   Right PERRL 4 3 Round Brisk None   Left PERRL 4 3 Round Brisk None       Visual Fields (Counting fingers)      Left Right    Full Full       Extraocular Movement      Right Left    Full Full       Neuro/Psych    Oriented x3: Yes   Mood/Affect: Normal       Dilation    Both eyes: 1.0% Mydriacyl, 2.5% Phenylephrine @ 8:25 AM        Slit Lamp and Fundus Exam    External Exam      Right Left   External Normal Normal       Slit Lamp Exam      Right Left   Lids/Lashes Normal Normal   Conjunctiva/Sclera White and quiet White and quiet   Cornea Clear Clear   Anterior Chamber Deep and quiet Deep and quiet   Iris Round and reactive,, no TI defects Round and reactive,, no TI defects   Lens Posterior chamber intraocular lens, multifocal Posterior chamber intraocular lens   Anterior Vitreous Normal Normal       Fundus Exam      Right Left   Posterior Vitreous Posterior vitreous detachment Posterior vitreous  detachment   Disc Peripapillary atrophy Peripapillary atrophy   C/D Ratio 0.35 0.35   Macula Hard drusen, no exudates, no hemorrhage, no macular thickening Cystoid macular edema   Vessels Normal Normal   Periphery Normal Normal          IMAGING AND PROCEDURES  Imaging and Procedures for 06/11/20  OCT, Retina - OU - Both Eyes       Right Eye Quality was good. Scan locations included subfoveal. Central Foveal Thickness: 262. Progression has been stable. Findings include normal foveal contour.   Left Eye Quality was good. Scan locations included subfoveal. Central Foveal Thickness: 492. Progression has worsened. Findings include abnormal foveal contour, cystoid macular edema.   Notes OS with center involved CME.  No evidence of CNVM.                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  NO obvious signs of intraocular lens change or malplacement. No signs of debris in the anterior chamber or in the anterior vitreous.  Patient has also been informed (12 years previous) not to rub the eye.  She denies rubbing the eye.  We will commence with topical therapy using PROLENSA once daily to maximize compliance.  Patient to use the drops twice daily for 5 days before cutting down to once a day.      ICD-10-CM   1. Cystoid macular edema of left eye  H35.352 OCT, Retina - OU - Both Eyes    1.  Clinical appearance and OCT discloses left eye this that this is most likely late onset pseudophakic CME.  There is no signs of iris chafe, no signs of and the patient denies any compression of the globe or pressure on the eye physically.  Nonetheless we will commence therapy with topical PROLENSA initially twice daily for 5 days followed thereafter by once daily.  2.  I asked patient to return to see [place] in 2 weeks to look for early signs of stabilization or improvement.  3.  Ophthalmic Meds Ordered this visit:    Meds ordered this encounter  Medications  . Bromfenac Sodium 0.09 % SOLN    Sig: Place 1 drop into the left eye every morning.    Dispense:  1.5 mL    Refill:  6       Return in about 2 weeks (around 06/25/2020) for dilate, OS, OCT.  Patient Instructions  Today asked to report any new onset of visual acuity distortion or decline in either eye    Explained the diagnoses, plan, and follow up with the patient and they expressed understanding.  Patient expressed understanding of the importance of proper follow up care.   Clent Demark Suzetta Timko M.D. Diseases & Surgery of the Retina and Vitreous Retina & Diabetic [place] 06/11/20     Abbreviations: M myopia (nearsighted); A astigmatism; H hyperopia (farsighted); P presbyopia; Mrx spectacle prescription;  CTL contact lenses; OD right eye; OS left eye; OU both eyes  XT exotropia; appear to be late onset cystoid macular edema this could be esotropia; PEK punctate epithelial keratitis; PEE punctate epithelial erosions; DES dry eye syndrome; MGD meibomian gland dysfunction; ATs artificial tears; PFAT's preservative free artificial tears; [place] nuclear sclerotic cataract; PSC posterior subcapsular cataract; ERM epi-retinal membrane; PVD posterior vitreous detachment; RD retinal detachment; DM diabetes mellitus; DR diabetic retinopathy; NPDR non-proliferative diabetic retinopathy; PDR proliferative diabetic retinopathy; CSME clinically significant macular edema; DME diabetic macular edema; dbh dot blot hemorrhages; CWS cotton wool spot; POAG primary open angle glaucoma; C/D cup-to-disc ratio; HVF humphrey visual field; GVF goldmann visual field; OCT optical coherence tomography; IOP intraocular pressure; BRVO Branch retinal vein occlusion; CRVO central retinal vein occlusion; CRAO central retinal artery occlusion; BRAO branch retinal artery occlusion; RT retinal tear; SB scleral buckle; PPV pars plana vitrectomy; VH Vitreous hemorrhage; PRP panretinal laser  photocoagulation; IVK intravitreal kenalog; VMT vitreomacular traction; MH Macular hole;  NVD neovascularization of the disc; NVE neovascularization elsewhere; AREDS age related eye disease study; ARMD age related macular degeneration; POAG primary open angle glaucoma; EBMD epithelial/anterior basement membrane dystrophy; ACIOL anterior chamber intraocular lens; IOL intraocular lens; PCIOL posterior chamber intraocular lens; Phaco/IOL phacoemulsification with intraocular lens placement; [place] photorefractive keratectomy; LASIK laser assisted in situ keratomileusis; HTN hypertension; DM diabetes mellitus; COPD chronic obstructive pulmonary disease

## 2020-06-11 NOTE — Assessment & Plan Note (Signed)
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  NO obvious signs of intraocular lens change or malplacement. No signs of debris in the anterior chamber or in the anterior vitreous.  Patient has also been informed (12 years previous) not to rub the eye.  She denies rubbing the eye.  We will commence with topical therapy using PROLENSA once daily to maximize compliance.  Patient to use the drops twice daily for 5 days before cutting down to once a day.

## 2020-06-25 ENCOUNTER — Other Ambulatory Visit: Payer: Self-pay

## 2020-06-25 ENCOUNTER — Ambulatory Visit (INDEPENDENT_AMBULATORY_CARE_PROVIDER_SITE_OTHER): Payer: PPO | Admitting: Ophthalmology

## 2020-06-25 ENCOUNTER — Encounter (INDEPENDENT_AMBULATORY_CARE_PROVIDER_SITE_OTHER): Payer: Self-pay | Admitting: Ophthalmology

## 2020-06-25 DIAGNOSIS — H35352 Cystoid macular degeneration, left eye: Secondary | ICD-10-CM | POA: Diagnosis not present

## 2020-06-25 NOTE — Assessment & Plan Note (Signed)
Late onset pseudophakic CME left eye, unknown etiology yet rapid improvement on topical NSAID, PROLENSA (bromfenac) twice daily and often now once daily  I will instruct the patient to use the PROLENSA long-term 3-4 times weekly.  Prescribed is a once daily medication however.

## 2020-06-25 NOTE — Progress Notes (Signed)
06/25/2020     CHIEF COMPLAINT Patient presents for Retina Follow Up   HISTORY OF PRESENT ILLNESS: Nancy Avila is a 79 y.o. female who presents to the clinic today for:   HPI    Retina Follow Up    Diagnosis: CME.  In left eye.  Severity is moderate.  Duration of 2 weeks.  Since onset it is stable.  I, the attending physician,  performed the HPI with the patient and updated documentation appropriately.          Comments    2 Week CME f\u OS. OCT  Pt states no changes or issues. Using gtts as directed.       Last edited by Tilda Franco on 06/25/2020  8:48 AM. (History)      Referring physician: Aletha Halim., PA-C [address],  [place] [address]  HISTORICAL INFORMATION:   Selected notes from the MEDICAL RECORD NUMBER       CURRENT MEDICATIONS: Current Outpatient Medications (Ophthalmic Drugs)  Medication Sig  . Bromfenac Sodium 0.09 % SOLN Place 1 drop into the left eye every morning.  . cycloSPORINE (RESTASIS) 0.05 % ophthalmic emulsion Place 1 drop into both eyes 2 (two) times daily.     No current facility-administered medications for this visit. (Ophthalmic Drugs)   Current Outpatient Medications (Other)  Medication Sig  . Calcium Carbonate-Vitamin D (CALTRATE 600+D) 600-400 MG-UNIT per tablet Take 1 tablet by mouth 2 (two) times daily.    . Cinnamon 500 MG TABS Take 1 tablet by mouth 2 (two) times daily.    . fish oil-omega-3 fatty acids 1000 MG capsule Take 1 g by mouth 2 (two) times daily.    . Garlic 580 MG TABS Take 1 tablet by mouth 2 (two) times daily.    . meclizine (ANTIVERT) 12.5 MG tablet Take 1 tablet (12.5 mg total) by mouth 3 (three) times daily as needed for dizziness.  . phenylephrine (SUDAFED PE) 10 MG TABS Take 10 mg by mouth 2 (two) times daily.    . Red Yeast Rice 600 MG CAPS Take 1 capsule by mouth 2 (two) times daily.    . vitamin C (ASCORBIC ACID) 500 MG tablet Take 500 mg by mouth 2 (two) times daily.    .  Zinc 100 MG TABS Take 1 tablet by mouth daily.     No current facility-administered medications for this visit. (Other)      REVIEW OF SYSTEMS:    ALLERGIES No Known Allergies  PAST MEDICAL HISTORY Past Medical History:  Diagnosis Date  . COPD (chronic obstructive pulmonary disease) ([place])    Past Surgical History:  Procedure Laterality Date  . ABDOMINAL HYSTERECTOMY    . CHOLECYSTECTOMY    . EYE SURGERY      FAMILY HISTORY Family History  Problem Relation Age of Onset  . Breast cancer Neg Hx     SOCIAL HISTORY Social History   Tobacco Use  . Smoking status: Never Smoker  . Smokeless tobacco: Never Used  Substance Use Topics  . Alcohol use: No  . Drug use: No         OPHTHALMIC EXAM: Base Eye Exam    Visual Acuity (Snellen - Linear)      Right Left   Dist cc 20/40 20/20 -2   Dist ph cc NI    Correction: Glasses       Tonometry (Tonopen, 8:53 AM)      Right Left  Pressure 8 11       Pupils      Pupils Dark Light Shape React APD   Right PERRL 4 3 Round Brisk None   Left PERRL 4 3 Round Brisk None       Neuro/Psych    Oriented x3: Yes   Mood/Affect: Normal       Dilation    Left eye: 1.0% Mydriacyl, 2.5% Phenylephrine @ 8:53 AM        Slit Lamp and Fundus Exam    External Exam      Right Left   External Normal Normal       Slit Lamp Exam      Right Left   Lids/Lashes Normal Normal   Conjunctiva/Sclera White and quiet White and quiet   Cornea Clear Clear   Anterior Chamber Deep and quiet Deep and quiet   Iris Round and reactive,, no TI defects Round and reactive,, no TI defects   Lens Posterior chamber intraocular lens, multifocal Posterior chamber intraocular lens   Anterior Vitreous Normal Normal       Fundus Exam      Right Left   Posterior Vitreous  Posterior vitreous detachment   Disc  Peripapillary atrophy   C/D Ratio  0.35   Macula  Cystoid macular edema   Vessels  Normal   Periphery  Normal          IMAGING  AND PROCEDURES  Imaging and Procedures for 06/25/20  OCT, Retina - OU - Both Eyes       Right Eye Quality was good. Scan locations included subfoveal. Central Foveal Thickness: 264. Progression has been stable.   Left Eye Quality was good. Scan locations included subfoveal. Central Foveal Thickness: 392. Progression has improved. Findings include cystoid macular edema.   Notes Much less CME OS, 2 weeks status post onset use PROLENSA.                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  Prescribed is a once daily medication however.      ICD-10-CM   1. Cystoid macular edema of left eye  H35.352 OCT, Retina - OU - Both Eyes    1.  Continue bromfenac (PROLENSA) left eye once daily for 7 days and thereafter 3-4 times weekly to prevent recurrence of late onset pseudophakic CME left eye  2.  Patient asked to report any new onset visual acuity declines or changes  3.  Ophthalmic Meds Ordered this visit:  No orders of the defined types were placed in this encounter.    Patient asked to continue PROLENSA (bromfenac) once daily for 7 days and thereafter 3-4 times weekly left eye  Return in about 6 months (around 12/23/2020) for DILATE OU, OCT.  There are no Patient Instructions on file for this visit.   Explained the diagnoses, plan, and follow up with the patient and they expressed understanding.  Patient expressed understanding of the importance of proper follow up care.   Clent Demark Sven Pinheiro M.D. Diseases & Surgery of the Retina and Vitreous Retina & Diabetic [place] 06/25/20     Abbreviations: M myopia (nearsighted); A astigmatism; H hyperopia (farsighted); P presbyopia; Mrx spectacle prescription;  CTL contact lenses; OD  right eye; OS left eye; OU  both eyes  XT exotropia; ET esotropia; PEK punctate epithelial keratitis; PEE punctate epithelial erosions; DES dry eye syndrome; MGD meibomian gland dysfunction; ATs artificial tears; PFAT's preservative free artificial tears; [place] nuclear sclerotic cataract; PSC posterior subcapsular cataract; ERM epi-retinal membrane; PVD posterior vitreous detachment; RD retinal detachment; DM diabetes mellitus; DR diabetic retinopathy; NPDR non-proliferative diabetic retinopathy; PDR proliferative diabetic retinopathy; CSME clinically significant macular edema; DME diabetic macular edema; dbh dot blot hemorrhages; CWS cotton wool spot; POAG primary open angle glaucoma; C/D cup-to-disc ratio; HVF humphrey visual field; GVF goldmann visual field; OCT optical coherence tomography; IOP intraocular pressure; BRVO Branch retinal vein occlusion; CRVO central retinal vein occlusion; CRAO central retinal artery occlusion; BRAO branch retinal artery occlusion; RT retinal tear; SB scleral buckle; PPV pars plana vitrectomy; VH Vitreous hemorrhage; PRP panretinal laser photocoagulation; IVK intravitreal kenalog; VMT vitreomacular traction; MH Macular hole;  NVD neovascularization of the disc; NVE neovascularization elsewhere; AREDS age related eye disease study; ARMD age related macular degeneration; POAG primary open angle glaucoma; EBMD epithelial/anterior basement membrane dystrophy; ACIOL anterior chamber intraocular lens; IOL intraocular lens; PCIOL posterior chamber intraocular lens; Phaco/IOL phacoemulsification with intraocular lens placement; [place] photorefractive keratectomy; LASIK laser assisted in situ keratomileusis; HTN hypertension; DM diabetes mellitus; COPD chronic obstructive pulmonary disease

## 2020-10-27 DIAGNOSIS — J069 Acute upper respiratory infection, unspecified: Secondary | ICD-10-CM | POA: Diagnosis not present

## 2020-10-27 DIAGNOSIS — J449 Chronic obstructive pulmonary disease, unspecified: Secondary | ICD-10-CM | POA: Diagnosis not present

## 2020-10-27 DIAGNOSIS — B9789 Other viral agents as the cause of diseases classified elsewhere: Secondary | ICD-10-CM | POA: Diagnosis not present

## 2020-12-23 ENCOUNTER — Ambulatory Visit (INDEPENDENT_AMBULATORY_CARE_PROVIDER_SITE_OTHER): Payer: PPO | Admitting: Ophthalmology

## 2020-12-23 ENCOUNTER — Other Ambulatory Visit: Payer: Self-pay

## 2020-12-23 DIAGNOSIS — H35352 Cystoid macular degeneration, left eye: Secondary | ICD-10-CM | POA: Diagnosis not present

## 2020-12-23 DIAGNOSIS — H5712 Ocular pain, left eye: Secondary | ICD-10-CM

## 2020-12-23 NOTE — Assessment & Plan Note (Signed)
No active maculopathy in either eye.,  CME in the left eye has maintained its resolution state while patient is using the topical PROLENSA, NSAID, on intermittent basis, when she "recalls its use"

## 2020-12-23 NOTE — Progress Notes (Signed)
12/23/2020     CHIEF COMPLAINT Patient presents for Retina Follow Up (6 Mo F/U OU//Pt sts OD has been "hurting" and she feels it is from allergies. Pt sts [facility] stable OS. Pt reports blurry VA OD with new glasses at distance.)   HISTORY OF PRESENT ILLNESS: Nancy Avila is a 80 y.o. female who presents to the clinic today for:   HPI    Retina Follow Up    Patient presents with  Other.  In left eye.  This started 6 months ago.  Severity is mild.  Duration of 6 months.  Since onset it is stable. Additional comments: 6 Mo F/U OU  Pt sts OD has been "hurting" and she feels it is from allergies. Pt sts [facility] stable OS. Pt reports blurry VA OD with new glasses at distance.       Last edited by Rockie Neighbours, [place] on 12/23/2020 10:42 AM. (History)      Referring physician: Aletha Halim., PA-C [id number]   [address]  HISTORICAL INFORMATION:   Selected notes from the MEDICAL RECORD NUMBER       CURRENT MEDICATIONS: Current Outpatient Medications (Ophthalmic Drugs)  Medication Sig  . Bromfenac Sodium 0.09 % SOLN Place 1 drop into the left eye every morning.  . cycloSPORINE (RESTASIS) 0.05 % ophthalmic emulsion Place 1 drop into both eyes 2 (two) times daily.   No current facility-administered medications for this visit. (Ophthalmic Drugs)   Current Outpatient Medications (Other)  Medication Sig  . Calcium Carbonate-Vitamin D (CALTRATE 600+D) 600-400 MG-UNIT per tablet Take 1 tablet by mouth 2 (two) times daily.    . Cinnamon 500 MG TABS Take 1 tablet by mouth 2 (two) times daily.    . fish oil-omega-3 fatty acids 1000 MG capsule Take 1 g by mouth 2 (two) times daily.    . Garlic 341 MG TABS Take 1 tablet by mouth 2 (two) times daily.    . meclizine (ANTIVERT) 12.5 MG tablet Take 1 tablet (12.5 mg total) by mouth 3 (three) times daily as needed for dizziness.  . phenylephrine (SUDAFED PE) 10 MG TABS Take 10 mg by mouth 2 (two) times daily.    . Red Yeast  Rice 600 MG CAPS Take 1 capsule by mouth 2 (two) times daily.    . vitamin C (ASCORBIC ACID) 500 MG tablet Take 500 mg by mouth 2 (two) times daily.    . Zinc 100 MG TABS Take 1 tablet by mouth daily.     No current facility-administered medications for this visit. (Other)      REVIEW OF SYSTEMS:    ALLERGIES No Known Allergies  PAST MEDICAL HISTORY Past Medical History:  Diagnosis Date  . COPD (chronic obstructive pulmonary disease) ([place])    Past Surgical History:  Procedure Laterality Date  . ABDOMINAL HYSTERECTOMY    . CHOLECYSTECTOMY    . EYE SURGERY      FAMILY HISTORY Family History  Problem Relation Age of Onset  . Breast cancer Neg Hx     SOCIAL HISTORY Social History   Tobacco Use  . Smoking status: Never Smoker  . Smokeless tobacco: Never Used  Substance Use Topics  . Alcohol use: No  . Drug use: No         OPHTHALMIC EXAM: Base Eye Exam    Visual Acuity (ETDRS)      Right Left   Dist cc 20/40 20/30 -1   Dist ph cc NI  NI   Correction: Glasses       Tonometry (Tonopen, 10:42 AM)      Right Left   Pressure 13 13       Pupils      Pupils Dark Light Shape React APD   Right PERRL 4 4 Round Minimal None   Left PERRL 4 4 Round Minimal None       Visual Fields (Counting fingers)      Left Right    Full Full       Extraocular Movement      Right Left    Full Full       Neuro/Psych    Oriented x3: Yes   Mood/Affect: Normal       Dilation    Both eyes: 1.0% Mydriacyl, 2.5% Phenylephrine @ 10:46 AM        Slit Lamp and Fundus Exam    External Exam      Right Left   External Normal Normal       Slit Lamp Exam      Right Left   Lids/Lashes Normal Normal   Conjunctiva/Sclera White and quiet White and quiet   Cornea Clear Clear   Anterior Chamber Deep and quiet Deep and quiet   Iris Round and reactive,, no TI defects Round and reactive,, no TI defects   Lens Posterior chamber intraocular lens, multifocal Posterior chamber  intraocular lens   Anterior Vitreous Normal Normal       Fundus Exam      Right Left   Posterior Vitreous Posterior vitreous detachment Posterior vitreous detachment   Disc Peripapillary atrophy mild Peripapillary atrophy   C/D Ratio 0.55 0.65   Macula Hard drusen, no exudates, no hemorrhage, no macular thickening, 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 Scan locations included subfoveal. Central Foveal Thickness: 270. Progression has been stable. Findings include abnormal foveal contour, retinal drusen , no IRF, no SRF.   Left Eye Quality was good. Scan locations included subfoveal. Central Foveal Thickness: 361. Progression has improved. Findings include cystoid macular edema, abnormal foveal contour, retinal drusen , no IRF, no SRF.   Notes No CME recurrence in the left eye while intermittently using PROLENSA                ASSESSMENT/PLAN:  Cystoid macular edema of left eye No active maculopathy in either eye.,  CME in the left eye has maintained its resolution state while patient is using the topical PROLENSA, NSAID, on intermittent basis, when she "recalls its use"    Eye pain, left I will suggest the patient use the drops once daily and see if her symptoms improve.  She can also add Tylenol 2 extra strength every 12 hours as needed which is a very good positive effect on relieving this type of pain should it be from the eye.      ICD-10-CM   1. Cystoid macular edema of left eye  H35.352 OCT, Retina - OU - Both Eyes  2. Eye pain, left  H57.12     1.  Have suggested the patient continue to use bromfenac once daily in the left eye only.  If her vague symptoms improve an option might be to use the  drops once daily in the right eye as well.  Thereafter she might be able to  to use the drops 3-4 times weekly in either eye to prevent this discomfort.  If she were to have itching this medication will have mild effect but there are over-the-counter drops called Pataday which will take away all itching and discomfort that could be coming from symptoms of allergy  2.  3.  Ophthalmic Meds Ordered this visit:  No orders of the defined types were placed in this encounter.      Return if symptoms worsen or fail to improve, and here as needed, for Follow-up Dr. Rutherford Guys as scheduled.  There are no Patient Instructions on file for this visit.   Explained the diagnoses, plan, and follow up with the patient and they expressed understanding.  Patient expressed understanding of the importance of proper follow up care.   Clent Demark Vernetta Dizdarevic M.D. Diseases & Surgery of the Retina and Vitreous [facility] [place] 12/23/20     Abbreviations: M myopia (nearsighted); A astigmatism; H hyperopia (farsighted); P presbyopia; Mrx spectacle prescription;  CTL contact lenses; OD right eye; OS left eye; OU both eyes  XT exotropia; ET esotropia; PEK punctate epithelial keratitis; PEE punctate epithelial erosions; DES dry eye syndrome; MGD meibomian gland dysfunction; ATs artificial tears; PFAT's preservative free artificial tears; [place] nuclear sclerotic cataract; PSC posterior subcapsular cataract; ERM epi-retinal membrane; PVD posterior vitreous detachment; RD retinal detachment; DM diabetes mellitus; DR diabetic retinopathy; NPDR non-proliferative diabetic retinopathy; PDR proliferative diabetic retinopathy; CSME clinically significant macular edema; DME diabetic macular edema; dbh dot blot hemorrhages; CWS cotton wool spot; POAG primary open angle glaucoma; C/D cup-to-disc ratio; HVF humphrey visual field; GVF goldmann visual field; OCT optical coherence tomography; IOP intraocular pressure; BRVO Branch retinal vein occlusion; CRVO central retinal vein occlusion; CRAO central  retinal artery occlusion; BRAO branch retinal artery occlusion; RT retinal tear; SB scleral buckle; PPV pars plana vitrectomy; VH Vitreous hemorrhage; PRP panretinal laser photocoagulation; IVK intravitreal kenalog; VMT vitreomacular traction; MH Macular hole;  NVD neovascularization of the disc; NVE neovascularization elsewhere; AREDS age related eye disease study; ARMD age related macular degeneration; POAG primary open angle glaucoma; EBMD epithelial/anterior basement membrane dystrophy; ACIOL anterior chamber intraocular lens; IOL intraocular lens; PCIOL posterior chamber intraocular lens; Phaco/IOL phacoemulsification with intraocular lens placement; [place] photorefractive keratectomy; LASIK laser assisted in situ keratomileusis; HTN hypertension; DM diabetes mellitus; COPD chronic obstructive pulmonary disease

## 2020-12-23 NOTE — Assessment & Plan Note (Signed)
I will suggest the patient use the drops once daily and see if her symptoms improve.  She can also add Tylenol 2 extra strength every 12 hours as needed which is a very good positive effect on relieving this type of pain should it be from the eye.

## 2021-04-27 DIAGNOSIS — F419 Anxiety disorder, unspecified: Secondary | ICD-10-CM | POA: Diagnosis not present

## 2021-04-27 DIAGNOSIS — Z0000 Encounter for general adult medical examination without abnormal findings: Secondary | ICD-10-CM | POA: Diagnosis not present

## 2021-04-27 DIAGNOSIS — E781 Pure hyperglyceridemia: Secondary | ICD-10-CM | POA: Diagnosis not present

## 2021-04-27 DIAGNOSIS — L989 Disorder of the skin and subcutaneous tissue, unspecified: Secondary | ICD-10-CM | POA: Diagnosis not present

## 2021-04-27 DIAGNOSIS — J449 Chronic obstructive pulmonary disease, unspecified: Secondary | ICD-10-CM | POA: Diagnosis not present

## 2021-05-14 DIAGNOSIS — Z1212 Encounter for screening for malignant neoplasm of rectum: Secondary | ICD-10-CM | POA: Diagnosis not present

## 2021-05-14 DIAGNOSIS — Z1211 Encounter for screening for malignant neoplasm of colon: Secondary | ICD-10-CM | POA: Diagnosis not present

## 2021-05-25 LAB — EXTERNAL GENERIC LAB PROCEDURE: COLOGUARD: NEGATIVE

## 2021-05-25 LAB — COLOGUARD: COLOGUARD: NEGATIVE

## 2021-08-05 DIAGNOSIS — J029 Acute pharyngitis, unspecified: Secondary | ICD-10-CM | POA: Diagnosis not present

## 2021-08-05 DIAGNOSIS — R638 Other symptoms and signs concerning food and fluid intake: Secondary | ICD-10-CM | POA: Diagnosis not present

## 2021-08-05 DIAGNOSIS — Z20822 Contact with and (suspected) exposure to covid-19: Secondary | ICD-10-CM | POA: Diagnosis not present

## 2021-08-05 DIAGNOSIS — R059 Cough, unspecified: Secondary | ICD-10-CM | POA: Diagnosis not present

## 2021-08-05 DIAGNOSIS — J441 Chronic obstructive pulmonary disease with (acute) exacerbation: Secondary | ICD-10-CM | POA: Diagnosis not present

## 2021-08-05 DIAGNOSIS — R0981 Nasal congestion: Secondary | ICD-10-CM | POA: Diagnosis not present

## 2021-08-11 IMAGING — CT CT ANGIO CHEST
2 series · 14 of 32 positions shown · IV contrast (APPLIED)
Comparison: None.

CLINICAL DATA: Node could positive diagnosis 08/08/2019. Short of
breath, concern for pulmonary embolism.

EXAM:
CT ANGIOGRAPHY CHEST WITH CONTRAST
TECHNIQUE: Multidetector CT imaging of the chest was performed using the
standard protocol during bolus administration of intravenous
contrast. Multiplanar CT image reconstructions and MIPs were
obtained to evaluate the vascular anatomy.
CONTRAST:  75mL T4RH6X-7UX IOPAMIDOL (T4RH6X-7UX) INJECTION 76%

[Series 4: pe 3.0 b31s · axial · 0.76mm/px · z∈[-255,-51]mm · 6 of 102 slices shown]
[im 17/102  lung]
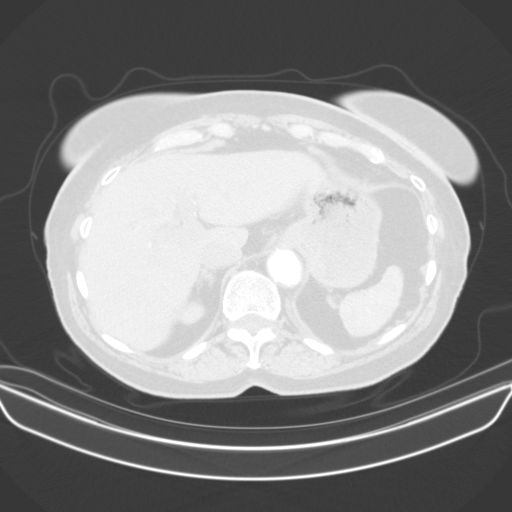
[im 34/102  mediastinal]
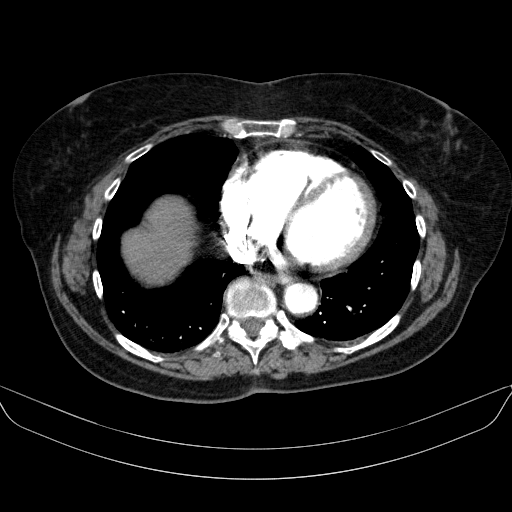
[im 49/102  lung]
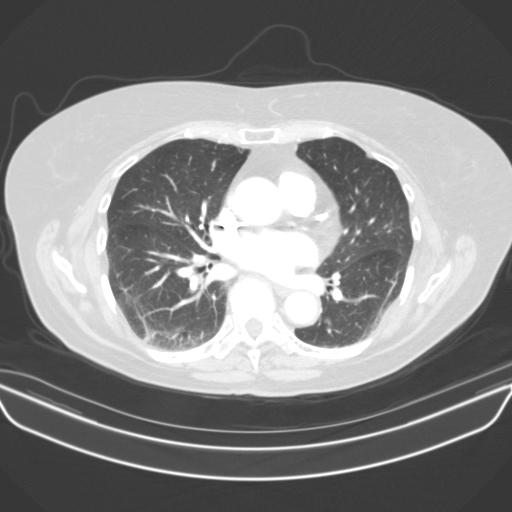
[im 51/102  mediastinal]
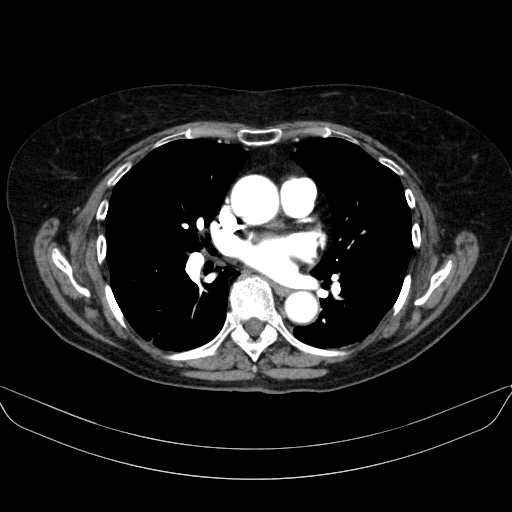
[im 68/102  lung]
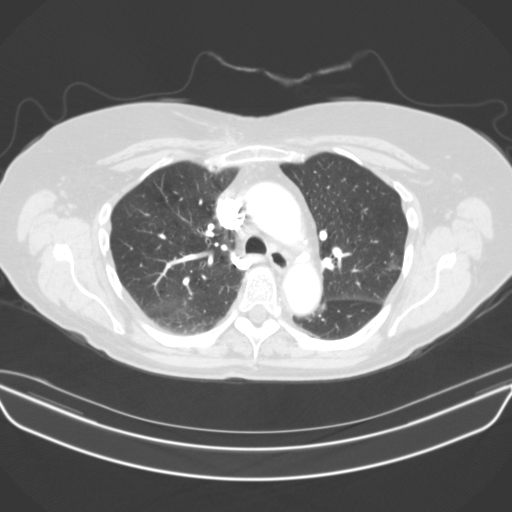
[im 85/102  mediastinal]
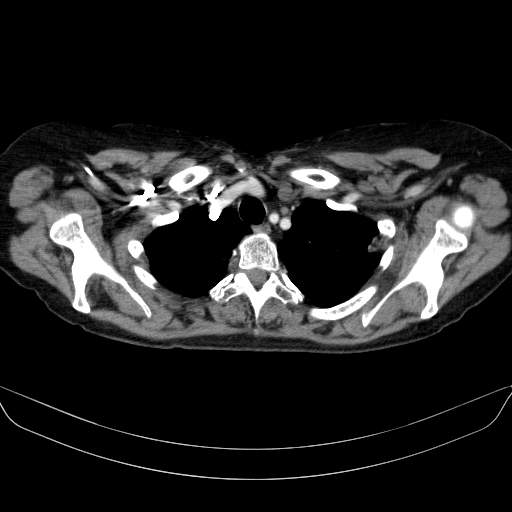

[Series 6: thins 1.0 b31s · axial · 0.76mm/px · z∈[-270,-30]mm · 8 of 300 slices shown]
[im 30/300  lung]
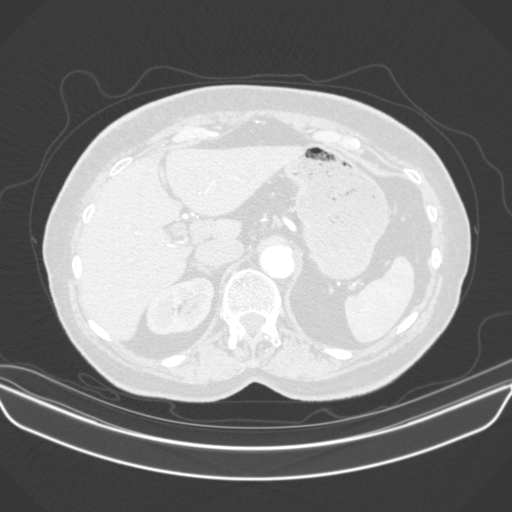
[im 75/300  lung]
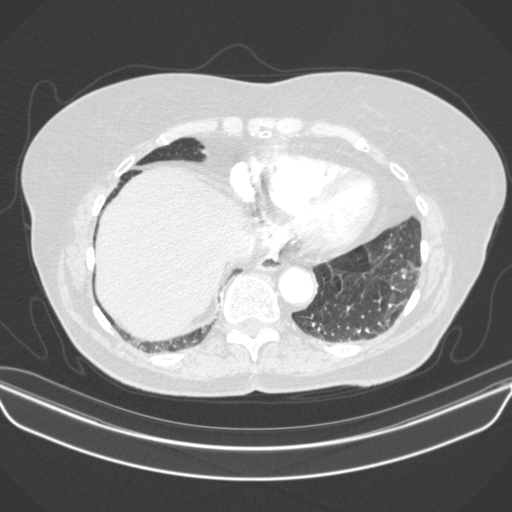
[im 100/300  lung]
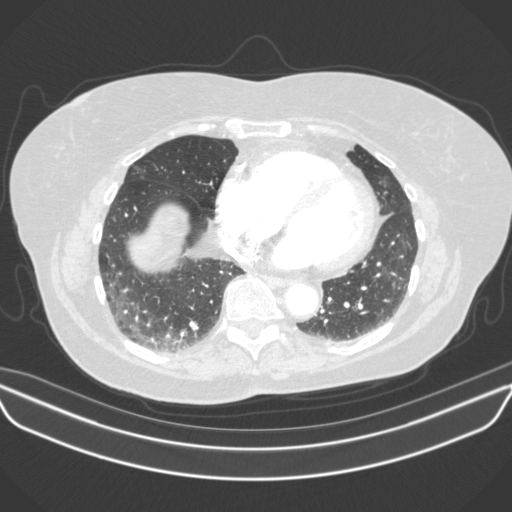
[im 135/300  lung]
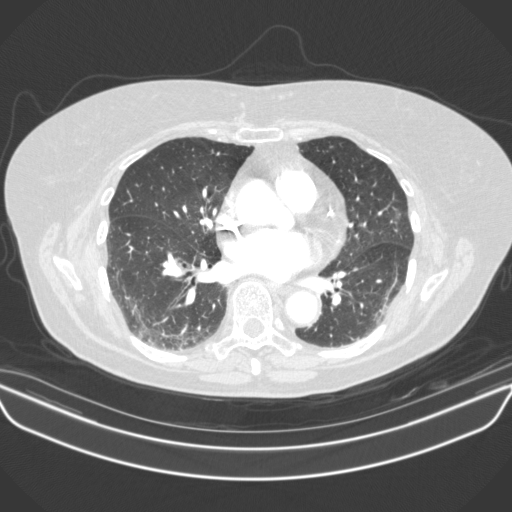
[im 165/300  lung]
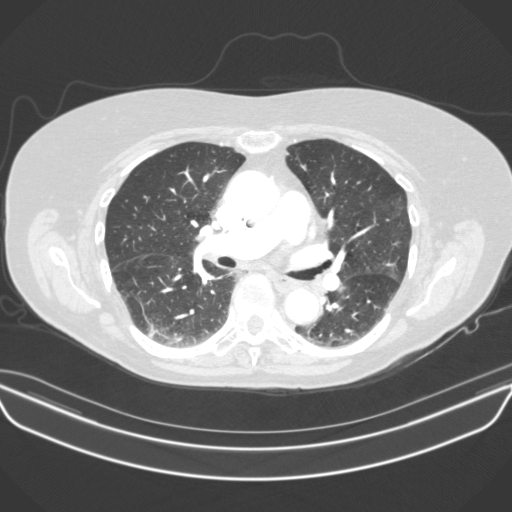
[im 200/300  lung]
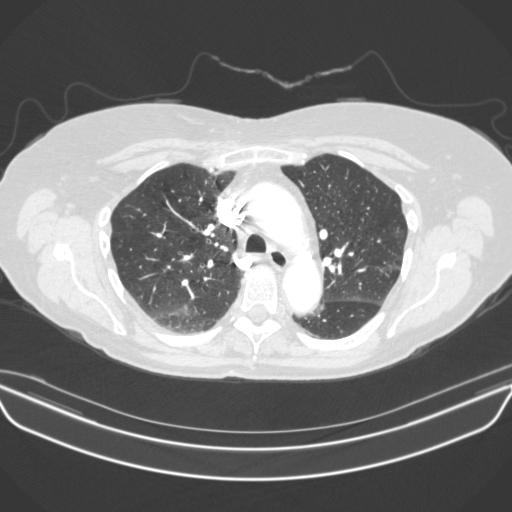
[im 225/300  lung]
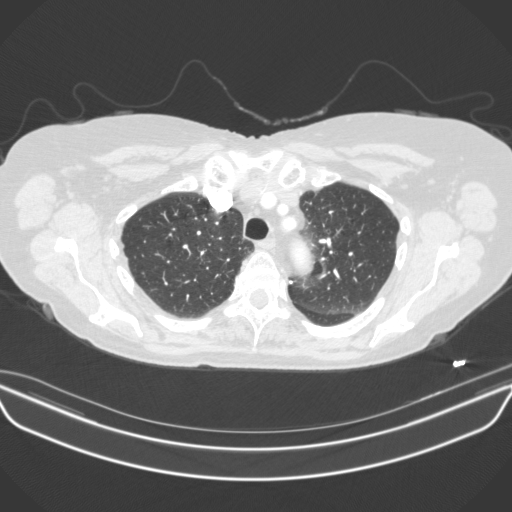
[im 270/300  lung]
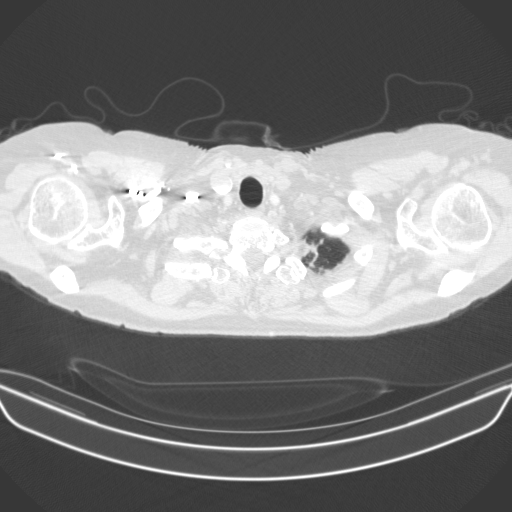

[14 of 32 positions shown; findings below may reference images not displayed]

FINDINGS: Cardiovascular: No filling defects within the pulmonary arteries to
suggest acute pulmonary embolism. No acute findings of the aorta or
great vessels. No pericardial fluid.

Coronary artery calcification and aortic atherosclerotic
calcification.

Mediastinum/Nodes: No axillary supraclavicular adenopathy.
Mediastinal hilar adenopathy. No pericardial effusion. Esophagus
normal.

Lungs/Pleura: Mild biapical pleuroparenchymal thickening. No
airspace disease.

Small nodule in the RIGHT upper lobe measures 2 mm (image 56/5).

5 mm nodule in the RIGHT lower lobe (image 71/5) compares to 4 mm on
comparison exam. Lesion appears more dense than prior. This may be
due to slice selection.

There is mild basilar atelectasis.

Upper Abdomen: Limited view of the liver, kidneys, pancreas are
unremarkable. Normal adrenal glands.

Musculoskeletal: No aggressive osseous lesion.

Review of the MIP images confirms the above findings.
IMPRESSION: 1. No evidence acute pulmonary embolism.
2. No evidence of pneumonia.
3. Small RIGHT lower lobe pulmonary nodule appears more dense. This
may relate to slice selection as there is minimal size change over 6
year interval. Recommend follow-up CT chest without contrast in 12
months per Fleischner criteria.

These results will be called to the ordering clinician or
representative by the Radiologist Assistant, and communication
documented in the PACS or zVision Dashboard.

## 2022-01-28 ENCOUNTER — Ambulatory Visit (INDEPENDENT_AMBULATORY_CARE_PROVIDER_SITE_OTHER): Payer: PPO | Admitting: Ophthalmology

## 2022-01-28 ENCOUNTER — Encounter (INDEPENDENT_AMBULATORY_CARE_PROVIDER_SITE_OTHER): Payer: Self-pay | Admitting: Ophthalmology

## 2022-01-28 DIAGNOSIS — B309 Viral conjunctivitis, unspecified: Secondary | ICD-10-CM | POA: Diagnosis not present

## 2022-01-28 DIAGNOSIS — H35352 Cystoid macular degeneration, left eye: Secondary | ICD-10-CM

## 2022-01-28 MED ORDER — LOTEPREDNOL-TOBRAMYCIN 0.5-0.3 % OP SUSP: 1.0000 [drp] | Freq: Two times a day (BID) | OPHTHALMIC | 0 refills | Status: AC

## 2022-01-28 NOTE — Assessment & Plan Note (Signed)
Ocular and hand hygiene reviewed with the patient. ? ?For symptoms patient to be placed on Zylet 1 drop twice daily only for 2 weeks.  I explained the patient there is no cure that these are simply to blunt and minimize some of the symptoms.  If her symptoms worsen contact the office promptly ?

## 2022-01-28 NOTE — Assessment & Plan Note (Signed)
No signs of CME while on NSAID.  Patient nearing her completion of her medication.  We will discontinue the drop in the left eye today. ?

## 2022-01-28 NOTE — Progress Notes (Signed)
? ? ?01/28/2022 ? ?  ? ?CHIEF COMPLAINT ?Patient presents for  ?Chief Complaint  ?Patient presents with  ? Eye Problem  ? ? ? ? ?HISTORY OF PRESENT ILLNESS: ?Nancy Avila is a 81 y.o. female who presents to the clinic today for:  ? ?HPI   ?OD- burning, painful, sore, sensitive to light.  Onset 2 days ago and worsened yesterday only OD ?Patient reports photophobia, soreness, but states it seems to have improved since this morning when she called for the appointment. Patient reports it started Tuesday, and it was really bad that day. Patient states "my right eye hasn't felt good for a couple months, it has felt strange. It feels like it is strained. It is deep in my eye, and over my eyelid it hurts."  ?Patient has not used any eye drops. ?Last edited by Hurman Horn, MD on 01/28/2022 11:07 AM.  ?  ? ? ?Referring physician: ?Rutherford Guys, MD ?[address] ?[place],  [place] [address] ? ?HISTORICAL INFORMATION:  ? ?Selected notes from the [facility] ?  ?   ? ?CURRENT MEDICATIONS: ?Current Outpatient Medications (Ophthalmic Drugs)  ?Medication Sig  ? Loteprednol-Tobramycin 0.5-0.3 % SUSP Place 1 drop into the right eye 2 (two) times daily for 14 days.  ? Bromfenac Sodium 0.09 % SOLN Place 1 drop into the left eye every morning.  ? cycloSPORINE (RESTASIS) 0.05 % ophthalmic emulsion Place 1 drop into both eyes 2 (two) times daily.  ? ?No current facility-administered medications for this visit. (Ophthalmic Drugs)  ? ?Current Outpatient Medications (Other)  ?Medication Sig  ? Calcium Carbonate-Vitamin D (CALTRATE 600+D) 600-400 MG-UNIT per tablet Take 1 tablet by mouth 2 (two) times daily.    ? Cinnamon 500 MG TABS Take 1 tablet by mouth 2 (two) times daily.    ? fish oil-omega-3 fatty acids 1000 MG capsule Take 1 g by mouth 2 (two) times daily.    ? Garlic 694 MG TABS Take 1 tablet by mouth 2 (two) times daily.    ? meclizine (ANTIVERT) 12.5 MG tablet Take 1 tablet (12.5 mg total) by mouth 3 (three) times  daily as needed for dizziness.  ? phenylephrine (SUDAFED PE) 10 MG TABS Take 10 mg by mouth 2 (two) times daily.    ? Red Yeast Rice 600 MG CAPS Take 1 capsule by mouth 2 (two) times daily.    ? vitamin C (ASCORBIC ACID) 500 MG tablet Take 500 mg by mouth 2 (two) times daily.    ? Zinc 100 MG TABS Take 1 tablet by mouth daily.    ? ?No current facility-administered medications for this visit. (Other)  ? ? ? ? ?REVIEW OF SYSTEMS: ?ROS   ?Negative for: Constitutional, Gastrointestinal, Neurological, Skin, Genitourinary, Musculoskeletal, HENT, Endocrine, Cardiovascular, Eyes, Respiratory, Psychiatric, Allergic/Imm, Heme/Lymph ?Last edited by Hurman Horn, MD on 01/28/2022 11:07 AM.  ?  ? ? ? ?ALLERGIES ?No Known Allergies ? ?PAST MEDICAL HISTORY ?Past Medical History:  ?Diagnosis Date  ? COPD (chronic obstructive pulmonary disease) ([place])   ? ?Past Surgical History:  ?Procedure Laterality Date  ? ABDOMINAL HYSTERECTOMY    ? CHOLECYSTECTOMY    ? EYE SURGERY    ? ? ?FAMILY HISTORY ?Family History  ?Problem Relation Age of Onset  ? Breast cancer Neg Hx   ? ? ?SOCIAL HISTORY ?Social History  ? ?Tobacco Use  ? Smoking status: Never  ? Smokeless tobacco: Never  ?Substance Use Topics  ? Alcohol use: No  ?  Drug use: No  ? ?  ? ?  ? ?OPHTHALMIC EXAM: ? ?Base Eye Exam   ? ? Visual Acuity (ETDRS)   ? ?   Right Left  ? Dist [place] 20/40 -1   ? Dist cc 20/50 -1+1 20/30 -2  ? Dist ph cc NI   ? ? Correction: Glasses  ? ?  ?  ? ? Tonometry (Tonopen, 10:22 AM)   ? ?   Right Left  ? Pressure 9 16  ? ?  ?  ? ? Pupils   ? ?   Pupils APD  ? Right PERRL None  ? Left PERRL None  ? ?  ?  ? ? Visual Fields   ? ?   Left Right  ?  Full Full  ? ?  ?  ? ? Extraocular Movement   ? ?   Right Left  ?  Full, Ortho Full, Ortho  ? ?  ?  ? ? Neuro/Psych   ? ? Oriented x3: Yes  ? Mood/Affect: Normal  ? ?  ?  ? ? Dilation   ? ? Both eyes: 1.0% Mydriacyl, 2.5% Phenylephrine @ 10:22 AM  ? ?  ?  ? ?  ? ?Slit Lamp and Fundus Exam   ? ? External Exam   ? ?   Right  Left  ? External Normal Normal  ? ?  ?  ? ? Slit Lamp Exam   ? ?   Right Left  ? Lids/Lashes Normal Normal  ? Conjunctiva/Sclera White and quiet White and quiet  ? Cornea Clear Clear  ? Anterior Chamber Deep and quiet Deep and quiet  ? Iris Round and reactive,, no TI defects Round and reactive,, no TI defects  ? Lens Posterior chamber intraocular lens, multifocal Posterior chamber intraocular lens  ? Anterior Vitreous Normal Normal  ? ?  ?  ? ? Fundus Exam   ? ?   Right Left  ? Posterior Vitreous Posterior vitreous detachment Posterior vitreous detachment  ? Disc Peripapillary atrophy mild Peripapillary atrophy  ? C/D Ratio 0.55 0.65  ? 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 Vessels Normal Normal  ? Periphery Normal Normal  ? ?  ?  ? ?  ? ? ?IMAGING AND PROCEDURES  ?Imaging and Procedures for 01/28/22 ? ?OCT, Retina - OU - Both Eyes   ? ?   ?Right Eye ?Quality was good. Scan locations included subfoveal. Central Foveal Thickness: 266. Progression has been stable. Findings include abnormal foveal contour, retinal drusen , no IRF, no SRF.  ? ?Left Eye ?Quality was good. Scan locations included subfoveal. Central Foveal Thickness: 355. Progression has improved. Findings include cystoid macular edema, abnormal foveal contour, retinal drusen , no IRF, no SRF.  ? ?Notes ?No CME recurrence in the left eye while intermittently using PROLENSA ? ?  ? ?Color Fundus Photography Optos - OU - Both Eyes   ? ?   ?Right Eye ?Progression has been stable. Disc findings include normal observations. Macula : normal observations. Vessels : normal observations. Periphery : normal observations.  ? ?Left Eye ?Progression has been stable. Disc findings include normal observations. Macula : normal observations. Vessels : normal observations. Periphery : normal observations.  ? ?  ? ? ?  ?  ? ?  ?ASSESSMENT/PLAN: ? ?Viral conjunctivitis of right eye ?Ocular and  hand hygiene reviewed with the patient. ? ?For symptoms patient to be placed  on Zylet 1 drop twice daily only for 2 weeks.  I explained the patient there is no cure that these are simply to blunt and minimize some of the symptoms.  If her symptoms worsen contact the office promptly ? ?Cystoid macular edema of left eye ?No signs of CME while on NSAID.  Patient nearing her completion of her medication.  We will discontinue the drop in the left eye today.  ? ?  ICD-10-CM   ?1. Cystoid macular edema of left eye  H35.352 OCT, Retina - OU - Both Eyes  ?  Color Fundus Photography Optos - OU - Both Eyes  ?  ?2. Viral conjunctivitis of right eye  B30.9   ?  ? ? ?1.  OS looks great. ? ?2.  No acute problem OD, likely viral conjunctivitis as patient has minor SPK and early follicular conjunctivitis on the right eye.  I explained the patient that this could spread to the left eye simply because of self contact and/or otherwise exposure to the tears from the right eye that is already occurred but the disease has not expressed itself yet in the left eye.  No prevention is possible.  No cure is possible.  Nonetheless the symptoms in the last 10 days we will give her Zylet medication in order to blunt the symptoms for tolerability and follow-up in 2 weeks ? ?3. ? ?Ophthalmic Meds Ordered this visit:  ?Meds ordered this encounter  ?Medications  ? Loteprednol-Tobramycin 0.5-0.3 % SUSP  ?  Sig: Place 1 drop into the right eye 2 (two) times daily for 14 days.  ?  Dispense:  5 mL  ?  Refill:  0  ? ? ?  ? ?Return in about 2 weeks (around 02/11/2022) for dilate, OS, OCT,, follow-up viral conjunctivitis OD. ? ?There are no Patient Instructions on file for this visit. ? ? ?Explained the diagnoses, plan, and follow up with the patient and they expressed understanding.  Patient expressed understanding of the importance of proper follow up care.  ? ?Clent Demark. Roshaunda Starkey M.D. ?Diseases & Surgery of the Retina and Vitreous ?[place] ?01/28/22 ? ? ? ? ?Abbreviations: ?M myopia (nearsighted); A astigmatism; H hyperopia (farsighted); P presbyopia; Mrx spectacle prescription;  CTL contact lenses; OD right eye; OS left eye; OU both eyes  XT

## 2022-02-11 ENCOUNTER — Ambulatory Visit (INDEPENDENT_AMBULATORY_CARE_PROVIDER_SITE_OTHER): Payer: PPO | Admitting: Ophthalmology

## 2022-02-11 ENCOUNTER — Encounter (INDEPENDENT_AMBULATORY_CARE_PROVIDER_SITE_OTHER): Payer: Self-pay | Admitting: Ophthalmology

## 2022-02-11 DIAGNOSIS — B309 Viral conjunctivitis, unspecified: Secondary | ICD-10-CM

## 2022-02-11 DIAGNOSIS — H35352 Cystoid macular degeneration, left eye: Secondary | ICD-10-CM | POA: Diagnosis not present

## 2022-02-11 NOTE — Patient Instructions (Signed)
Patient instructed to discontinue Zylet immediately.  The use of 1 drop weekly if redness irritation only in the right eye develops but this is discouraged.  Patient encouraged not to share this drop is likely contagious as well holding remnants of the virus prior conjunctivitis

## 2022-02-11 NOTE — Assessment & Plan Note (Signed)
CME OS has not recurred off of topical NSAID.  We will continue to monitor and observe

## 2022-02-11 NOTE — Progress Notes (Signed)
02/11/2022     CHIEF COMPLAINT Patient presents for  Chief Complaint  Patient presents with   Cystoid Macular Edema      HISTORY OF PRESENT ILLNESS: Nancy Avila is a 81 y.o. female who presents to the clinic today for:   HPI   2 weeks for DILATE, OS, OCT,, Follow up viral-conjunctivitis, OD. Pt stated vision has been stable since last visit. Pt reports, "I had an infection in my right eye and then it felt better. Then it felt worse. And then it felt better. Today my right eye feels better than before." Pt denies floaters and FOL.  Last edited by Silvestre Moment on 02/11/2022 10:20 AM.      Referring physician: Aletha Halim., PA-C [address],  [place] [address]  HISTORICAL INFORMATION:   Selected notes from the MEDICAL RECORD NUMBER       CURRENT MEDICATIONS: Current Outpatient Medications (Ophthalmic Drugs)  Medication Sig   Bromfenac Sodium 0.09 % SOLN Place 1 drop into the left eye every morning.   cycloSPORINE (RESTASIS) 0.05 % ophthalmic emulsion Place 1 drop into both eyes 2 (two) times daily.   Loteprednol-Tobramycin 0.5-0.3 % SUSP Place 1 drop into the right eye 2 (two) times daily for 14 days.   No current facility-administered medications for this visit. (Ophthalmic Drugs)   Current Outpatient Medications (Other)  Medication Sig   Calcium Carbonate-Vitamin D (CALTRATE 600+D) 600-400 MG-UNIT per tablet Take 1 tablet by mouth 2 (two) times daily.     Cinnamon 500 MG TABS Take 1 tablet by mouth 2 (two) times daily.     fish oil-omega-3 fatty acids 1000 MG capsule Take 1 g by mouth 2 (two) times daily.     Garlic 762 MG TABS Take 1 tablet by mouth 2 (two) times daily.     meclizine (ANTIVERT) 12.5 MG tablet Take 1 tablet (12.5 mg total) by mouth 3 (three) times daily as needed for dizziness.   phenylephrine (SUDAFED PE) 10 MG TABS Take 10 mg by mouth 2 (two) times daily.     Red Yeast Rice 600 MG CAPS Take 1 capsule by mouth 2 (two) times daily.      vitamin C (ASCORBIC ACID) 500 MG tablet Take 500 mg by mouth 2 (two) times daily.     Zinc 100 MG TABS Take 1 tablet by mouth daily.     No current facility-administered medications for this visit. (Other)      REVIEW OF SYSTEMS: ROS   Negative for: Constitutional, Gastrointestinal, Neurological, Skin, Genitourinary, Musculoskeletal, HENT, Endocrine, Cardiovascular, Eyes, Respiratory, Psychiatric, Allergic/Imm, Heme/Lymph Last edited by Silvestre Moment on 02/11/2022 10:20 AM.       ALLERGIES No Known Allergies  PAST MEDICAL HISTORY Past Medical History:  Diagnosis Date   COPD (chronic obstructive pulmonary disease) ([place])    Past Surgical History:  Procedure Laterality Date   ABDOMINAL HYSTERECTOMY     CHOLECYSTECTOMY     EYE SURGERY      FAMILY HISTORY Family History  Problem Relation Age of Onset   Breast cancer Neg Hx     SOCIAL HISTORY Social History   Tobacco Use   Smoking status: Never   Smokeless tobacco: Never  Substance Use Topics   Alcohol use: No   Drug use: No         OPHTHALMIC EXAM:  Base Eye Exam     Visual Acuity (ETDRS)       Right Left   Dist  cc 20/40 -2 20/20 -2   Dist ph cc NI     Correction: Glasses         Tonometry (Tonopen, 10:27 AM)       Right Left   Pressure 12 10         Pupils       Pupils APD   Right PERRL None   Left PERRL None         Visual Fields       Left Right    Full Full         Extraocular Movement       Right Left    Full Full         Neuro/Psych     Oriented x3: Yes   Mood/Affect: Normal         Dilation     Left eye: 2.5% Phenylephrine, 1.0% Mydriacyl @ 10:27 AM           Slit Lamp and Fundus Exam     External Exam       Right Left   External Normal, no preauricular node Normal         Slit Lamp Exam       Right Left   Lids/Lashes Normal Normal   Conjunctiva/Sclera No dilation, white and quiet, no follicles remain White and quiet   Cornea Clear Clear    Anterior Chamber Deep and quiet Deep and quiet   Iris Round and reactive,, no TI defects Round and reactive,, no TI defects   Lens Posterior chamber intraocular lens, multifocal Posterior chamber intraocular lens   Anterior Vitreous Normal Normal         Fundus Exam       Right Left   Posterior Vitreous  Posterior vitreous detachment   Disc  Peripapillary atrophy   C/D Ratio  0.65   Macula  Normal   Vessels  Normal   Periphery  Normal            IMAGING AND PROCEDURES  Imaging and Procedures for 02/11/22  OCT, Retina - OU - Both Eyes       Right Eye Quality was good. Scan locations included subfoveal. Central Foveal Thickness: 277. Progression has been stable. Findings include abnormal foveal contour, retinal drusen , no IRF, no SRF.   Left Eye Quality was good. Scan locations included subfoveal. Central Foveal Thickness: 353. Progression has improved. Findings include abnormal foveal contour, retinal drusen , no IRF, no SRF.   Notes No CME recurrence in the left eye, as patient no longer using topical therapy OS             ASSESSMENT/PLAN:  Cystoid macular edema of left eye CME OS has not recurred off of topical NSAID.  We will continue to monitor and observe  Viral conjunctivitis of right eye OD has improved at 1 drop twice daily.  Symptoms well present condition is improved I have assured the patient that the clinical viral course might simply be completed over the next 1 to 2 weeks.  Patient instructed to immediately stop the medication to the right eye and never use it again because it still contagious and do not share it     ICD-10-CM   1. Cystoid macular edema of left eye  H35.352 OCT, Retina - OU - Both Eyes    2. Viral conjunctivitis of right eye  B30.9       1.  Viral conjunctivitis has improved OD.  Nose no  signs of follicular conjunctivitis remaining eye is white  2.  OS CME did not recur off of Topical NSAID, will continue to monitor and  observe  3.  Ophthalmic Meds Ordered this visit:  No orders of the defined types were placed in this encounter.      Return in about 3 months (around 05/14/2022) for dilate, OS, OCT.  Patient Instructions  Patient instructed to discontinue Zylet immediately.  The use of 1 drop weekly if redness irritation only in the right eye develops but this is discouraged.  Patient encouraged not to share this drop is likely contagious as well holding remnants of the virus prior conjunctivitis  Explained the diagnoses, plan, and follow up with the patient and they expressed understanding.  Patient expressed understanding of the importance of proper follow up care.   Clent Demark Everly Rubalcava M.D. Diseases & Surgery of the Retina and Vitreous [facility] 02/11/22     Abbreviations: M myopia (nearsighted); A astigmatism; H hyperopia (farsighted); P presbyopia; Mrx spectacle prescription;  CTL contact lenses; OD right eye; OS left eye; OU both eyes  XT exotropia; ET esotropia; PEK punctate epithelial keratitis; PEE punctate epithelial erosions; DES dry eye syndrome; MGD meibomian gland dysfunction; ATs artificial tears; PFAT's preservative free artificial tears; [place] nuclear sclerotic cataract; PSC posterior subcapsular cataract; ERM epi-retinal membrane; PVD posterior vitreous detachment; RD retinal detachment; DM diabetes mellitus; DR diabetic retinopathy; NPDR non-proliferative diabetic retinopathy; PDR proliferative diabetic retinopathy; CSME clinically significant macular edema; DME diabetic macular edema; dbh dot blot hemorrhages; CWS cotton wool spot; POAG primary open angle glaucoma; C/D cup-to-disc ratio; HVF humphrey visual field; GVF goldmann visual field; OCT optical coherence tomography; IOP intraocular pressure; BRVO Branch retinal vein occlusion; CRVO central retinal vein occlusion; CRAO central retinal artery occlusion; BRAO branch retinal artery occlusion; RT retinal tear; SB scleral  buckle; PPV pars plana vitrectomy; VH Vitreous hemorrhage; PRP panretinal laser photocoagulation; IVK intravitreal kenalog; VMT vitreomacular traction; MH Macular hole;  NVD neovascularization of the disc; NVE neovascularization elsewhere; AREDS age related eye disease study; ARMD age related macular degeneration; POAG primary open angle glaucoma; EBMD epithelial/anterior basement membrane dystrophy; ACIOL anterior chamber intraocular lens; IOL intraocular lens; PCIOL posterior chamber intraocular lens; Phaco/IOL phacoemulsification with intraocular lens placement; [place] photorefractive keratectomy; LASIK laser assisted in situ keratomileusis; HTN hypertension; DM diabetes mellitus; COPD chronic obstructive pulmonary disease

## 2022-02-11 NOTE — Assessment & Plan Note (Addendum)
OD has improved at 1 drop twice daily.  Symptoms well present condition is improved I have assured the patient that the clinical viral course might simply be completed over the next 1 to 2 weeks.  Patient instructed to immediately stop the medication to the right eye and never use it again because it still contagious and do not share it

## 2022-05-17 ENCOUNTER — Encounter (INDEPENDENT_AMBULATORY_CARE_PROVIDER_SITE_OTHER): Payer: PPO | Admitting: Ophthalmology

## 2022-05-27 ENCOUNTER — Ambulatory Visit (INDEPENDENT_AMBULATORY_CARE_PROVIDER_SITE_OTHER): Payer: PPO | Admitting: Ophthalmology

## 2022-05-27 ENCOUNTER — Encounter (INDEPENDENT_AMBULATORY_CARE_PROVIDER_SITE_OTHER): Payer: Self-pay | Admitting: Ophthalmology

## 2022-05-27 DIAGNOSIS — H35352 Cystoid macular degeneration, left eye: Secondary | ICD-10-CM | POA: Diagnosis not present

## 2022-05-27 DIAGNOSIS — H5712 Ocular pain, left eye: Secondary | ICD-10-CM | POA: Diagnosis not present

## 2022-05-27 DIAGNOSIS — B309 Viral conjunctivitis, unspecified: Secondary | ICD-10-CM | POA: Diagnosis not present

## 2022-05-27 NOTE — Assessment & Plan Note (Signed)
Resolved

## 2022-05-27 NOTE — Progress Notes (Signed)
05/27/2022     CHIEF COMPLAINT Patient presents for  Chief Complaint  Patient presents with   Cystoid Macular Edema      HISTORY OF PRESENT ILLNESS: Nancy Avila is a 81 y.o. female who presents to the clinic today for:   HPI   3 MOS for DILATE OS, OCT. Pt stated vision has been stable since last visit. Pt reports pain when administrating eyedrops for IOP.  Last edited by Silvestre Moment on 05/27/2022 10:14 AM.      Referring physician: Aletha Halim., PA-C [address]  [place] [address]  HISTORICAL INFORMATION:   Selected notes from the MEDICAL RECORD NUMBER       CURRENT MEDICATIONS: Current Outpatient Medications (Ophthalmic Drugs)  Medication Sig   Bromfenac Sodium 0.09 % SOLN Place 1 drop into the left eye every morning.   cycloSPORINE (RESTASIS) 0.05 % ophthalmic emulsion Place 1 drop into both eyes 2 (two) times daily.   No current facility-administered medications for this visit. (Ophthalmic Drugs)   Current Outpatient Medications (Other)  Medication Sig   Calcium Carbonate-Vitamin D (CALTRATE 600+D) 600-400 MG-UNIT per tablet Take 1 tablet by mouth 2 (two) times daily.     Cinnamon 500 MG TABS Take 1 tablet by mouth 2 (two) times daily.     fish oil-omega-3 fatty acids 1000 MG capsule Take 1 g by mouth 2 (two) times daily.     Garlic 604 MG TABS Take 1 tablet by mouth 2 (two) times daily.     meclizine (ANTIVERT) 12.5 MG tablet Take 1 tablet (12.5 mg total) by mouth 3 (three) times daily as needed for dizziness.   phenylephrine (SUDAFED PE) 10 MG TABS Take 10 mg by mouth 2 (two) times daily.     Red Yeast Rice 600 MG CAPS Take 1 capsule by mouth 2 (two) times daily.     vitamin C (ASCORBIC ACID) 500 MG tablet Take 500 mg by mouth 2 (two) times daily.     Zinc 100 MG TABS Take 1 tablet by mouth daily.     No current facility-administered medications for this visit. (Other)      REVIEW OF SYSTEMS: ROS   Negative for: Constitutional,  Gastrointestinal, Neurological, Skin, Genitourinary, Musculoskeletal, HENT, Endocrine, Cardiovascular, Eyes, Respiratory, Psychiatric, Allergic/Imm, Heme/Lymph Last edited by Silvestre Moment on 05/27/2022 10:08 AM.       ALLERGIES No Known Allergies  PAST MEDICAL HISTORY Past Medical History:  Diagnosis Date   COPD (chronic obstructive pulmonary disease) ([place])    Past Surgical History:  Procedure Laterality Date   ABDOMINAL HYSTERECTOMY     CHOLECYSTECTOMY     EYE SURGERY      FAMILY HISTORY Family History  Problem Relation Age of Onset   Breast cancer Neg Hx     SOCIAL HISTORY Social History   Tobacco Use   Smoking status: Never   Smokeless tobacco: Never  Substance Use Topics   Alcohol use: No   Drug use: No         OPHTHALMIC EXAM:  Base Eye Exam     Visual Acuity (ETDRS)       Right Left   Dist cc 20/40 20/30   Dist ph cc NI NI    Correction: Glasses         Tonometry (Tonopen, 10:13 AM)       Right Left   Pressure 11 13         Pupils  Pupils APD   Right PERRL None   Left PERRL None         Visual Fields       Left Right    Full Full         Extraocular Movement       Right Left    Full, Ortho Full, Ortho         Neuro/Psych     Oriented x3: Yes   Mood/Affect: Normal         Dilation     Left eye: 2.5% Phenylephrine, 1.0% Mydriacyl @ 10:13 AM           Slit Lamp and Fundus Exam     External Exam       Right Left   External Normal, no preauricular node Normal         Slit Lamp Exam       Right Left   Lids/Lashes Normal Normal   Conjunctiva/Sclera No dilation, white and quiet, no follicles remain White and quiet   Cornea Clear Clear   Anterior Chamber Deep and quiet Deep and quiet   Iris Round and reactive,, no TI defects Round and reactive,, no TI defects   Lens Posterior chamber intraocular lens, multifocal Posterior chamber intraocular lens   Anterior Vitreous Normal Normal          Fundus Exam       Right Left   Posterior Vitreous  Posterior vitreous detachment   Disc  Peripapillary atrophy   C/D Ratio  0.65   Macula  Normal   Vessels  Normal   Periphery  Normal            IMAGING AND PROCEDURES  Imaging and Procedures for 05/27/22  OCT, Retina - OU - Both Eyes       Right Eye Quality was good. Scan locations included subfoveal. Central Foveal Thickness: 270. Progression has been stable. Findings include no IRF, no SRF, abnormal foveal contour, retinal drusen .   Left Eye Quality was good. Scan locations included subfoveal. Central Foveal Thickness: 352. Progression has improved. Findings include no IRF, no SRF, abnormal foveal contour, retinal drusen .   Notes No CME recurrence in the left eye, as patient no longer using topical therapy OS              ASSESSMENT/PLAN:  Eye pain, left OS not recurrent  Viral conjunctivitis of right eye Resolved     ICD-10-CM   1. Cystoid macular edema of left eye  H35.352 OCT, Retina - OU - Both Eyes    2. Eye pain, left  H57.12     3. Viral conjunctivitis of right eye  B30.9       1.  CME in the left eye has not recurred off of all topical medications for CME.  Return completely Dr. Rutherford Guys and here on a as needed basis  2.  3.  Ophthalmic Meds Ordered this visit:  No orders of the defined types were placed in this encounter.      Return if symptoms worsen or fail to improve.  There are no Patient Instructions on file for this visit.   Explained the diagnoses, plan, and follow up with the patient and they expressed understanding.  Patient expressed understanding of the importance of proper follow up care.   Clent Demark Shaelynn Dragos M.D. Diseases & Surgery of the Retina and Vitreous [facility] 05/27/22     Abbreviations: M myopia (nearsighted);  A astigmatism; H hyperopia (farsighted); P presbyopia; Mrx spectacle prescription;  CTL contact lenses; OD right eye; OS  left eye; OU both eyes  XT exotropia; ET esotropia; PEK punctate epithelial keratitis; PEE punctate epithelial erosions; DES dry eye syndrome; MGD meibomian gland dysfunction; ATs artificial tears; PFAT's preservative free artificial tears; [place] nuclear sclerotic cataract; PSC posterior subcapsular cataract; ERM epi-retinal membrane; PVD posterior vitreous detachment; RD retinal detachment; DM diabetes mellitus; DR diabetic retinopathy; NPDR non-proliferative diabetic retinopathy; PDR proliferative diabetic retinopathy; CSME clinically significant macular edema; DME diabetic macular edema; dbh dot blot hemorrhages; CWS cotton wool spot; POAG primary open angle glaucoma; C/D cup-to-disc ratio; HVF humphrey visual field; GVF goldmann visual field; OCT optical coherence tomography; IOP intraocular pressure; BRVO Branch retinal vein occlusion; CRVO central retinal vein occlusion; CRAO central retinal artery occlusion; BRAO branch retinal artery occlusion; RT retinal tear; SB scleral buckle; PPV pars plana vitrectomy; VH Vitreous hemorrhage; PRP panretinal laser photocoagulation; IVK intravitreal kenalog; VMT vitreomacular traction; MH Macular hole;  NVD neovascularization of the disc; NVE neovascularization elsewhere; AREDS age related eye disease study; ARMD age related macular degeneration; POAG primary open angle glaucoma; EBMD epithelial/anterior basement membrane dystrophy; ACIOL anterior chamber intraocular lens; IOL intraocular lens; PCIOL posterior chamber intraocular lens; Phaco/IOL phacoemulsification with intraocular lens placement; [place] photorefractive keratectomy; LASIK laser assisted in situ keratomileusis; HTN hypertension; DM diabetes mellitus; COPD chronic obstructive pulmonary disease

## 2022-05-27 NOTE — Assessment & Plan Note (Signed)
OS not recurrent

## 2023-09-06 NOTE — Progress Notes (Unsigned)
No chief complaint on file.  History of Present Illness: 82 yo female with history of COPD who is here today as a new patient for the evaluation of ***  Primary Care Physician: Richmond Campbell., PA-C   Past Medical History:  Diagnosis Date   COPD (chronic obstructive pulmonary disease) ([facility])     Past Surgical History:  Procedure Laterality Date   ABDOMINAL HYSTERECTOMY     CHOLECYSTECTOMY     EYE SURGERY      Current Outpatient Medications  Medication Sig Dispense Refill   Bromfenac Sodium 0.09 % SOLN Place 1 drop into the left eye every morning. 1.5 mL 6   Calcium Carbonate-Vitamin D (CALTRATE 600+D) 600-400 MG-UNIT per tablet Take 1 tablet by mouth 2 (two) times daily.       Cinnamon 500 MG TABS Take 1 tablet by mouth 2 (two) times daily.       cycloSPORINE (RESTASIS) 0.05 % ophthalmic emulsion Place 1 drop into both eyes 2 (two) times daily.     fish oil-omega-3 fatty acids 1000 MG capsule Take 1 g by mouth 2 (two) times daily.       Garlic 100 MG TABS Take 1 tablet by mouth 2 (two) times daily.       meclizine (ANTIVERT) 12.5 MG tablet Take 1 tablet (12.5 mg total) by mouth 3 (three) times daily as needed for dizziness. 30 tablet 0   phenylephrine (SUDAFED PE) 10 MG TABS Take 10 mg by mouth 2 (two) times daily.       Red Yeast Rice 600 MG CAPS Take 1 capsule by mouth 2 (two) times daily.       vitamin C (ASCORBIC ACID) 500 MG tablet Take 500 mg by mouth 2 (two) times daily.       Zinc 100 MG TABS Take 1 tablet by mouth daily.       No current facility-administered medications for this visit.    No Known Allergies  Social History   Socioeconomic History   Marital status: Widowed    Spouse name: Not on file   Number of children: Not on file   Years of education: Not on file   Highest education level: Not on file  Occupational History   Not on file  Tobacco Use   Smoking status: Never   Smokeless tobacco: Never  Substance and Sexual Activity   Alcohol use:  No   Drug use: No   Sexual activity: Not on file  Other Topics Concern   Not on file  Social History Narrative   Not on file   Social Determinants of Health   Financial Resource Strain: Not on file  Food Insecurity: Not on file  Transportation Needs: Not on file  Physical Activity: Not on file  Stress: Not on file  Social Connections: Not on file  Intimate Partner Violence: Not on file    Family History  Problem Relation Age of Onset   Breast cancer Neg Hx     Review of Systems:  As stated in the HPI and otherwise negative.   There were no vitals taken for this visit.  Physical Examination: General: Well developed, well nourished, NAD  HEENT: OP clear, mucus membranes moist  SKIN: warm, dry. No rashes. Neuro: No focal deficits  Musculoskeletal: Muscle strength 5/5 all ext  Psychiatric: Mood and affect normal  Neck: No JVD, no carotid bruits, no thyromegaly, no lymphadenopathy.  Lungs:Clear bilaterally, no wheezes, rhonci, crackles Cardiovascular: Regular rate and rhythm.  No murmurs, gallops or rubs. Abdomen:Soft. Bowel sounds present. Non-tender.  Extremities: No lower extremity edema. Pulses are 2 + in the bilateral DP/PT.  EKG:  EKG {ACTION; IS/IS [id number]} ordered today. The ekg ordered today demonstrates ***  Recent Labs: No results found for requested labs within last 365 days.   Lipid Panel No results found for: "CHOL", "TRIG", "HDL", "CHOLHDL", "VLDL", "LDLCALC", "LDLDIRECT"   Wt Readings from Last 3 Encounters:  08/02/13 74.8 kg      Assessment and Plan:   1.   Labs/ tests ordered today include:  No orders of the defined types were placed in this encounter.    Disposition:   F/U with me in ***    Signed, Verne Carrow, MD, [facility] 09/06/2023 4:33 PM    [facility] [address] [place], [place], [place]  [address] Phone: [phone]; Fax: [phone]

## 2023-09-07 ENCOUNTER — Ambulatory Visit: Payer: PPO | Attending: Cardiovascular Disease | Admitting: Cardiovascular Disease

## 2023-09-07 VITALS — BP 142/88 | HR 91 | Ht 68.5 in | Wt 173.8 lb

## 2023-09-07 DIAGNOSIS — Z09 Encounter for follow-up examination after completed treatment for conditions other than malignant neoplasm: Secondary | ICD-10-CM | POA: Diagnosis not present

## 2023-09-07 DIAGNOSIS — I4819 Other persistent atrial fibrillation: Secondary | ICD-10-CM

## 2023-09-07 MED ORDER — METOPROLOL SUCCINATE ER 25 MG PO TB24: 25.0000 mg | ORAL_TABLET | Freq: Every day | ORAL | 3 refills | Status: AC

## 2023-09-07 MED ORDER — APIXABAN 5 MG PO TABS: 5.0000 mg | ORAL_TABLET | Freq: Two times a day (BID) | ORAL | 5 refills | Status: DC

## 2023-09-07 NOTE — Patient Instructions (Signed)
Medication Instructions:  Your physician has recommended you make the following change in your medication:  1.) start apixaban, (Eliquis) 5 mg - take one tablet twice a day   *If you need a refill on your cardiac medications before your next appointment, please call your pharmacy*   Lab Work: none If you have labs (blood work) drawn today and your tests are completely normal, you will receive your results only by: MyChart Message (if you have MyChart) OR A paper copy in the mail If you have any lab test that is abnormal or we need to change your treatment, we will call you to review the results.   Testing/Procedures: Your physician has requested that you have an echocardiogram. Echocardiography is a painless test that uses sound waves to create images of your heart. It provides your doctor with information about the size and shape of your heart and how well your heart's chambers and valves are working. This procedure takes approximately one hour. There are no restrictions for this procedure. Please do NOT wear cologne, perfume, aftershave, or lotions (deodorant is allowed). Please arrive 15 minutes prior to your appointment time.  Please note: We ask at that you not bring children with you during ultrasound (echo/ vascular) testing. Due to room size and safety concerns, children are not allowed in the ultrasound rooms during exams. Our front office staff cannot provide observation of children in our lobby area while testing is being conducted. An adult accompanying a patient to their appointment will only be allowed in the ultrasound room at the discretion of the ultrasound technician under special circumstances. We apologize for any inconvenience.    Follow-Up: As planned in afib clnic

## 2023-09-20 ENCOUNTER — Ambulatory Visit (HOSPITAL_COMMUNITY): Payer: PPO | Admitting: Internal Medicine

## 2023-10-05 ENCOUNTER — Ambulatory Visit (HOSPITAL_COMMUNITY): Payer: PPO | Admitting: Physician Assistant

## 2023-10-17 ENCOUNTER — Ambulatory Visit (HOSPITAL_COMMUNITY): Payer: PPO | Attending: Cardiovascular Disease

## 2023-10-17 DIAGNOSIS — I4819 Other persistent atrial fibrillation: Secondary | ICD-10-CM | POA: Diagnosis present

## 2023-10-17 LAB — ECHOCARDIOGRAM COMPLETE
Area-P 1/2: 3.42 cm2
MV M vel: 5.21 m/s
MV Peak grad: 108.4 mm[Hg]
P 1/2 time: 176 ms
Radius: 0.65 cm
S' Lateral: 2.8 cm

## 2023-10-25 ENCOUNTER — Encounter (HOSPITAL_COMMUNITY): Payer: Self-pay | Admitting: Internal Medicine

## 2023-10-25 ENCOUNTER — Other Ambulatory Visit (HOSPITAL_COMMUNITY): Payer: Self-pay | Admitting: Internal Medicine

## 2023-10-25 ENCOUNTER — Ambulatory Visit (HOSPITAL_COMMUNITY)
Admission: RE | Admit: 2023-10-25 | Discharge: 2023-10-25 | Disposition: A | Discharge status: Home / Self Care | Payer: PPO | Source: Ambulatory Visit | Attending: Internal Medicine | Admitting: Internal Medicine

## 2023-10-25 VITALS — BP 146/100 | HR 98 | Ht 68.5 in | Wt 173.6 lb

## 2023-10-25 DIAGNOSIS — D6869 Other thrombophilia: Secondary | ICD-10-CM | POA: Diagnosis not present

## 2023-10-25 DIAGNOSIS — I4819 Other persistent atrial fibrillation: Secondary | ICD-10-CM | POA: Insufficient documentation

## 2023-10-25 DIAGNOSIS — J449 Chronic obstructive pulmonary disease, unspecified: Secondary | ICD-10-CM | POA: Diagnosis not present

## 2023-10-25 DIAGNOSIS — Z7901 Long term (current) use of anticoagulants: Secondary | ICD-10-CM | POA: Diagnosis not present

## 2023-10-25 DIAGNOSIS — F419 Anxiety disorder, unspecified: Secondary | ICD-10-CM | POA: Insufficient documentation

## 2023-10-25 DIAGNOSIS — Z87891 Personal history of nicotine dependence: Secondary | ICD-10-CM | POA: Diagnosis not present

## 2023-10-25 DIAGNOSIS — Z79899 Other long term (current) drug therapy: Secondary | ICD-10-CM | POA: Insufficient documentation

## 2023-10-25 LAB — CBC
HCT: 42 % (ref 36.0–46.0)
Hemoglobin: 14.1 g/dL (ref 12.0–15.0)
MCH: 30.5 pg (ref 26.0–34.0)
MCHC: 33.6 g/dL (ref 30.0–36.0)
MCV: 90.9 fL (ref 80.0–100.0)
Platelets: 156 10*3/uL (ref 150–400)
RBC: 4.62 MIL/uL (ref 3.87–5.11)
RDW: 13.8 % (ref 11.5–15.5)
WBC: 8.2 10*3/uL (ref 4.0–10.5)
nRBC: 0 % (ref 0.0–0.2)

## 2023-10-25 LAB — BASIC METABOLIC PANEL
Anion gap: 13 (ref 5–15)
BUN: 18 mg/dL (ref 8–23)
CO2: 23 mmol/L (ref 22–32)
Calcium: 9.5 mg/dL (ref 8.9–10.3)
Chloride: 102 mmol/L (ref 98–111)
Creatinine, Ser: 1.11 mg/dL — ABNORMAL HIGH (ref 0.44–1.00)
GFR, Estimated: 50 mL/min — ABNORMAL LOW (ref >60)
Glucose, Bld: 105 mg/dL — ABNORMAL HIGH (ref 70–99)
Potassium: 4.6 mmol/L (ref 3.5–5.1)
Sodium: 138 mmol/L (ref 135–145)

## 2023-10-25 NOTE — Patient Instructions (Addendum)
Cardioversion scheduled for: February 4th 2025   - Arrive at the [facility] and go to admitting at 10:00 am   - Do not eat or drink anything after midnight the night prior to your procedure.   - Take all your morning medication (except diabetic medications) with a sip of water prior to arrival.  - You will not be able to drive home after your procedure.    - Do NOT miss any doses of your blood thinner - if you should miss a dose please notify our office immediately.   - If you feel as if you go back into normal rhythm prior to scheduled cardioversion, please notify our office immediately.   If your procedure is canceled in the cardioversion suite you will be charged a cancellation fee.           For those patients who have a scheduled procedure/anesthesia on the same day of the week as their dose, hold the medication on the day of surgery.  They can take their scheduled dose the week before.  **Patients on the above medications scheduled for elective procedures that have not held the medication for the appropriate amount of time are at risk of cancellation or change in the anesthetic plan.

## 2023-10-25 NOTE — Progress Notes (Signed)
Primary Care Physician: Richmond Campbell., PA-C Primary Cardiologist: Verne Carrow, MD Electrophysiologist: None     Referring Physician: Dr. Nash Shearer Newcom is a 83 y.o. female with a history of anxiety, COPD, former tobacco use, and paroxsymal atrial fibrillation who presents for consultation in the [facility]. Seen by Dr. Clifton James on 09/07/23 for new Afib noted by PCP. Toprol increased to 25 mg daily. Patient is on Eliquis 5 mg BID for a CHADS2VASC score of 3.  On evaluation today, she is currently in Afib. She was unable to tolerate Toprol 25 mg daily due to hypotension and not feeling well. She has remained on Toprol 12.5 mg daily. No missed doses of Eliquis; she took 2 doses on 12/12. She maybe feels more SOB but not sure if it's Afib or her COPD.    Today, she denies symptoms of palpitations, chest pain, orthopnea, PND, lower extremity edema, dizziness, presyncope, syncope, snoring, daytime somnolence, bleeding, or neurologic sequela. The patient is tolerating medications without difficulties and is otherwise without complaint today.    she has a BMI of Body mass index is 26.01 kg/m.Marland Kitchen Filed Weights   10/25/23 1114  Weight: 78.7 kg    Current Outpatient Medications  Medication Sig Dispense Refill   albuterol (PROVENTIL) (2.5 MG/3ML) 0.083% nebulizer solution Take 2.5 mg by nebulization every 4 (four) hours as needed for wheezing or shortness of breath.     apixaban (ELIQUIS) 5 MG TABS tablet Take 1 tablet (5 mg total) by mouth 2 (two) times daily. 60 tablet 5   clonazePAM (KLONOPIN) 0.5 MG tablet Take 0.5 mg by mouth as needed for anxiety. Take 1/2 tablet if needed     fish oil-omega-3 fatty acids 1000 MG capsule Take 1 g by mouth 2 (two) times daily.       fluticasone (FLONASE) 50 MCG/ACT nasal spray Place 2 sprays into both nostrils daily.     magnesium 30 MG tablet Take 30 mg by mouth 2 (two) times daily.     metoprolol  succinate (TOPROL XL) 25 MG 24 hr tablet Take 1 tablet (25 mg total) by mouth daily. (Patient taking differently: Take 12.5 mg by mouth daily.) 90 tablet 3   TRELEGY ELLIPTA 100-62.5-25 MCG/ACT AEPB Inhale into the lungs.     vitamin B-12 (CYANOCOBALAMIN) 100 MCG tablet Take 100 mcg by mouth daily.     vitamin C (ASCORBIC ACID) 500 MG tablet Take 500 mg by mouth 2 (two) times daily.       Zinc 100 MG TABS Take 1 tablet by mouth daily.       No current facility-administered medications for this encounter.    Atrial Fibrillation Management history:  Previous antiarrhythmic drugs: none Previous cardioversions: none Previous ablations: none Anticoagulation history: Eliquis   ROS- All systems are reviewed and negative except as per the HPI above.  Physical Exam: BP (!) 146/100   Pulse 98   Ht 5' 8.5" (1.74 m)   Wt 78.7 kg   BMI 26.01 kg/m   GEN: Well nourished, well developed in no acute distress NECK: No JVD; No carotid bruits CARDIAC: Irregularly irregular rate and rhythm, no murmurs, rubs, gallops RESPIRATORY:  Clear to auscultation without rales, wheezing or rhonchi  ABDOMEN: Soft, non-tender, non-distended EXTREMITIES:  No edema; No deformity   EKG today demonstrates  Vent. rate 98 BPM PR interval * ms QRS duration 86 ms QT/QTcB 330/421 ms P-R-T axes * 38 80 Atrial  fibrillation with premature ventricular or aberrantly conducted complexes Nonspecific T wave abnormality Abnormal ECG When compared with ECG of 07-Sep-2023 11:40, PREVIOUS ECG IS PRESENT  Echo 10/17/23 demonstrated  1. Left ventricular ejection fraction, by estimation, is 60 to 65%. The  left ventricle has normal function. The left ventricle has no regional  wall motion abnormalities. Left ventricular diastolic function could not  be evaluated.   2. Right ventricular systolic function is normal. The right ventricular  size is normal. There is normal pulmonary artery systolic pressure.   3. Left atrial  size was moderately dilated.   4. Right atrial size was mildly dilated.   5. The mitral valve is normal in structure. Moderate mitral valve  regurgitation likely related to atrial dilation. No evidence of mitral  stenosis.   6. The aortic valve is normal in structure. Aortic valve regurgitation is  mild. No aortic stenosis is present.   7. Aortic dilatation noted. There is borderline dilatation of the  ascending aorta, measuring 39 mm.   8. The inferior vena cava is normal in size with greater than 50%  respiratory variability, suggesting right atrial pressure of 3 mmHg.    ASSESSMENT & PLAN CHA2DS2-VASc Score = 3  The patient's score is based upon: CHF History: 0 HTN History: 0 Diabetes History: 0 Stroke History: 0 Vascular Disease History: 0 Age Score: 2 Gender Score: 1       ASSESSMENT AND PLAN: Persistent Atrial Fibrillation (ICD10:  I48.19) The patient's CHA2DS2-VASc score is 3, indicating a 3.2% annual risk of stroke.    She is in Afib. We discussed treatment options and cardioversion procedure to attempt to convert to NSR. After discussion, she agrees to proceed with cardioversion. We did discuss briefly options for rhythm control including AAD and ablation if she has ERAF. Labs obtained today. Continue Toprol 12.5 mg daily. Recommended rhythm monitoring device which would likely be Kardiamobile for patient.   Informed Consent   Shared Decision Making/Informed Consent The risks (stroke, cardiac arrhythmias rarely resulting in the need for a temporary or permanent pacemaker, skin irritation or burns and complications associated with conscious sedation including aspiration, arrhythmia, respiratory failure and death), benefits (restoration of normal sinus rhythm) and alternatives of a direct current cardioversion were explained in detail to Ms. Meyn and she agrees to proceed.       Secondary Hypercoagulable State (ICD10:  D68.69) The patient is at significant risk for  stroke/thromboembolism based upon her CHA2DS2-VASc Score of 3.  Continue Apixaban (Eliquis).  No missed doses.     Follow up 2 weeks after DCCV.    Lake Bells, PA-C  [facility] [address] [place], [place] [address] [phone]

## 2023-10-25 NOTE — H&P (View-Only) (Signed)
 Primary Care Physician: Nancy Avila., PA-C Primary Cardiologist: Nancy Carrow, MD Electrophysiologist: None     Referring Physician: Dr. Nash Avila Nancy Avila is a 83 y.o. female with a history of anxiety, COPD, former tobacco use, and paroxsymal atrial fibrillation who presents for consultation in the [facility]. Seen by Dr. Clifton Avila on 09/07/23 for new Afib noted by PCP. Toprol increased to 25 mg daily. Patient is on Eliquis 5 mg BID for a CHADS2VASC score of 3.  On evaluation today, she is currently in Afib. She was unable to tolerate Toprol 25 mg daily due to hypotension and not feeling well. She has remained on Toprol 12.5 mg daily. No missed doses of Eliquis; she took 2 doses on 12/12. She maybe feels more SOB but not sure if it's Afib or her COPD.    Today, she denies symptoms of palpitations, chest pain, orthopnea, PND, lower extremity edema, dizziness, presyncope, syncope, snoring, daytime somnolence, bleeding, or neurologic sequela. The patient is tolerating medications without difficulties and is otherwise without complaint today.    she has a BMI of Body mass index is 26.01 kg/m.Marland Kitchen Filed Weights   10/25/23 1114  Weight: 78.7 kg    Current Outpatient Medications  Medication Sig Dispense Refill   albuterol (PROVENTIL) (2.5 MG/3ML) 0.083% nebulizer solution Take 2.5 mg by nebulization every 4 (four) hours as needed for wheezing or shortness of breath.     apixaban (ELIQUIS) 5 MG TABS tablet Take 1 tablet (5 mg total) by mouth 2 (two) times daily. 60 tablet 5   clonazePAM (KLONOPIN) 0.5 MG tablet Take 0.5 mg by mouth as needed for anxiety. Take 1/2 tablet if needed     fish oil-omega-3 fatty acids 1000 MG capsule Take 1 g by mouth 2 (two) times daily.       fluticasone (FLONASE) 50 MCG/ACT nasal spray Place 2 sprays into both nostrils daily.     magnesium 30 MG tablet Take 30 mg by mouth 2 (two) times daily.     metoprolol  succinate (TOPROL XL) 25 MG 24 hr tablet Take 1 tablet (25 mg total) by mouth daily. (Patient taking differently: Take 12.5 mg by mouth daily.) 90 tablet 3   TRELEGY ELLIPTA 100-62.5-25 MCG/ACT AEPB Inhale into the lungs.     vitamin B-12 (CYANOCOBALAMIN) 100 MCG tablet Take 100 mcg by mouth daily.     vitamin C (ASCORBIC ACID) 500 MG tablet Take 500 mg by mouth 2 (two) times daily.       Zinc 100 MG TABS Take 1 tablet by mouth daily.       No current facility-administered medications for this encounter.    Atrial Fibrillation Management history:  Previous antiarrhythmic drugs: none Previous cardioversions: none Previous ablations: none Anticoagulation history: Eliquis   ROS- All systems are reviewed and negative except as per the HPI above.  Physical Exam: BP (!) 146/100   Pulse 98   Ht 5' 8.5" (1.74 m)   Wt 78.7 kg   BMI 26.01 kg/m   GEN: Well nourished, well developed in no acute distress NECK: No JVD; No carotid bruits CARDIAC: Irregularly irregular rate and rhythm, no murmurs, rubs, gallops RESPIRATORY:  Clear to auscultation without rales, wheezing or rhonchi  ABDOMEN: Soft, non-tender, non-distended EXTREMITIES:  No edema; No deformity   EKG today demonstrates  Vent. rate 98 BPM PR interval * ms QRS duration 86 ms QT/QTcB 330/421 ms P-R-T axes * 38 80 Atrial  fibrillation with premature ventricular or aberrantly conducted complexes Nonspecific T wave abnormality Abnormal ECG When compared with ECG of 07-Sep-2023 11:40, PREVIOUS ECG IS PRESENT  Echo 10/17/23 demonstrated  1. Left ventricular ejection fraction, by estimation, is 60 to 65%. The  left ventricle has normal function. The left ventricle has no regional  wall motion abnormalities. Left ventricular diastolic function could not  be evaluated.   2. Right ventricular systolic function is normal. The right ventricular  size is normal. There is normal pulmonary artery systolic pressure.   3. Left atrial  size was moderately dilated.   4. Right atrial size was mildly dilated.   5. The mitral valve is normal in structure. Moderate mitral valve  regurgitation likely related to atrial dilation. No evidence of mitral  stenosis.   6. The aortic valve is normal in structure. Aortic valve regurgitation is  mild. No aortic stenosis is present.   7. Aortic dilatation noted. There is borderline dilatation of the  ascending aorta, measuring 39 mm.   8. The inferior vena cava is normal in size with greater than 50%  respiratory variability, suggesting right atrial pressure of 3 mmHg.    ASSESSMENT & PLAN CHA2DS2-VASc Score = 3  The patient's score is based upon: CHF History: 0 HTN History: 0 Diabetes History: 0 Stroke History: 0 Vascular Disease History: 0 Age Score: 2 Gender Score: 1       ASSESSMENT AND PLAN: Persistent Atrial Fibrillation (ICD10:  I48.19) The patient's CHA2DS2-VASc score is 3, indicating a 3.2% annual risk of stroke.    She is in Afib. We discussed treatment options and cardioversion procedure to attempt to convert to NSR. After discussion, she agrees to proceed with cardioversion. We did discuss briefly options for rhythm control including AAD and ablation if she has ERAF. Labs obtained today. Continue Toprol 12.5 mg daily. Recommended rhythm monitoring device which would likely be Kardiamobile for patient.   Informed Consent   Shared Decision Making/Informed Consent The risks (stroke, cardiac arrhythmias rarely resulting in the need for a temporary or permanent pacemaker, skin irritation or burns and complications associated with conscious sedation including aspiration, arrhythmia, respiratory failure and death), benefits (restoration of normal sinus rhythm) and alternatives of a direct current cardioversion were explained in detail to Nancy Avila and she agrees to proceed.       Secondary Hypercoagulable State (ICD10:  D68.69) The patient is at significant risk for  stroke/thromboembolism based upon her CHA2DS2-VASc Score of 3.  Continue Apixaban (Eliquis).  No missed doses.     Follow up 2 weeks after DCCV.    Nancy Bells, PA-C  [facility] [address] [place], [place] [address] [phone]

## 2023-10-26 ENCOUNTER — Other Ambulatory Visit: Payer: Self-pay

## 2023-10-26 DIAGNOSIS — I4819 Other persistent atrial fibrillation: Secondary | ICD-10-CM

## 2023-10-26 DIAGNOSIS — Q209 Congenital malformation of cardiac chambers and connections, unspecified: Secondary | ICD-10-CM

## 2023-10-28 ENCOUNTER — Telehealth (HOSPITAL_COMMUNITY): Payer: Self-pay

## 2023-10-28 NOTE — Telephone Encounter (Signed)
Patient called to let us know she has developed a cough. She is scheduled for Cardioversion next Tuesday. No fever or wheezing. She is currently taking Tessalon Perles as needed for cough. Patient was told to contact our clinic back on Monday to let us know how she is doing and we will determine for sure if we will continue to proceed with her Cardioversion which is scheduled for the next following day. Consulted with patient and she verbalized understanding.

## 2023-10-31 NOTE — Progress Notes (Signed)
Spoke to pt and instructed them to come at 1045 and to be NPO after 0000.  Confirmed no missed doses of AC and instructed to take in AM with a small sip of water.  Confirmed that pt will have a ride home and someone to stay with them for 24 hours after the procedure. Instructed patient to not wear any jewelry or lotion.

## 2023-11-01 ENCOUNTER — Encounter (HOSPITAL_COMMUNITY): Payer: Self-pay | Admitting: Internal Medicine

## 2023-11-01 ENCOUNTER — Ambulatory Visit (HOSPITAL_COMMUNITY): Payer: HMO | Admitting: Anesthesiology

## 2023-11-01 ENCOUNTER — Ambulatory Visit (HOSPITAL_COMMUNITY)
Admission: RE | Admit: 2023-11-01 | Discharge: 2023-11-01 | Disposition: A | Discharge status: Home / Self Care | Payer: HMO | Attending: Internal Medicine | Admitting: Internal Medicine

## 2023-11-01 ENCOUNTER — Other Ambulatory Visit: Payer: Self-pay

## 2023-11-01 ENCOUNTER — Encounter (HOSPITAL_COMMUNITY)
Admission: RE | Disposition: A | Discharge status: Home / Self Care | Payer: Self-pay | Source: Home / Self Care | Attending: Internal Medicine

## 2023-11-01 ENCOUNTER — Ambulatory Visit (HOSPITAL_BASED_OUTPATIENT_CLINIC_OR_DEPARTMENT_OTHER): Payer: HMO | Admitting: Anesthesiology

## 2023-11-01 DIAGNOSIS — D6869 Other thrombophilia: Secondary | ICD-10-CM | POA: Diagnosis not present

## 2023-11-01 DIAGNOSIS — J449 Chronic obstructive pulmonary disease, unspecified: Secondary | ICD-10-CM | POA: Diagnosis not present

## 2023-11-01 DIAGNOSIS — F419 Anxiety disorder, unspecified: Secondary | ICD-10-CM | POA: Insufficient documentation

## 2023-11-01 DIAGNOSIS — Z79899 Other long term (current) drug therapy: Secondary | ICD-10-CM | POA: Insufficient documentation

## 2023-11-01 DIAGNOSIS — Z87891 Personal history of nicotine dependence: Secondary | ICD-10-CM | POA: Diagnosis not present

## 2023-11-01 DIAGNOSIS — Z7901 Long term (current) use of anticoagulants: Secondary | ICD-10-CM | POA: Diagnosis not present

## 2023-11-01 DIAGNOSIS — I4819 Other persistent atrial fibrillation: Secondary | ICD-10-CM | POA: Insufficient documentation

## 2023-11-01 DIAGNOSIS — I4891 Unspecified atrial fibrillation: Secondary | ICD-10-CM

## 2023-11-01 HISTORY — PX: CARDIOVERSION: EP1203

## 2023-11-01 SURGERY — CARDIOVERSION (CATH LAB)
Anesthesia: General

## 2023-11-01 MED ORDER — LIDOCAINE 2% (20 MG/ML) 5 ML SYRINGE
INTRAMUSCULAR | Status: DC | PRN
  Administered 2023-11-01: 50 mg via INTRAVENOUS

## 2023-11-01 MED ORDER — PROPOFOL 10 MG/ML IV BOLUS
INTRAVENOUS | Status: DC | PRN
  Administered 2023-11-01: 50 mg via INTRAVENOUS

## 2023-11-01 SURGICAL SUPPLY — 1 items: PAD DEFIB RADIO PHYSIO CONN (PAD) ×2 IMPLANT

## 2023-11-01 NOTE — Anesthesia Preprocedure Evaluation (Addendum)
Anesthesia Evaluation  Patient identified by MRN, date of birth, ID band Patient awake    Reviewed: Allergy & Precautions, NPO status , Patient's Chart, lab work & pertinent test results  History of Anesthesia Complications Negative for: history of anesthetic complications  Airway Mallampati: II  TM Distance: >3 FB Neck ROM: Full    Dental  (+) Lower Dentures, Upper Dentures   Pulmonary COPD,  COPD inhaler, former smoker   Pulmonary exam normal        Cardiovascular + dysrhythmias Atrial Fibrillation + Valvular Problems/Murmurs MR  Rhythm:Irregular Rate:Normal   '25 TTE - EF 60 to 65%. Left atrial size was moderately dilated. Right atrial size was mildly dilated. Moderate mitral valve regurgitation likely related to atrial dilation. Aortic valve regurgitation is mild. There is borderline dilatation of the ascending aorta, measuring 39 mm.     Neuro/Psych  PSYCHIATRIC DISORDERS Anxiety     negative neurological ROS     GI/Hepatic negative GI ROS, Neg liver ROS,,,  Endo/Other  negative endocrine ROS    Renal/GU negative Renal ROS     Musculoskeletal negative musculoskeletal ROS (+)    Abdominal   Peds  Hematology  On eliquis    Anesthesia Other Findings   Reproductive/Obstetrics                             Anesthesia Physical Anesthesia Plan  ASA: 3  Anesthesia Plan: General   Post-op Pain Management: Minimal or no pain anticipated   Induction: Intravenous  PONV Risk Score and Plan: 3 and Treatment may vary due to age or medical condition and Propofol infusion  Airway Management Planned: Natural Airway and Mask  Additional Equipment: None  Intra-op Plan:   Post-operative Plan:   Informed Consent: I have reviewed the patients History and Physical, chart, labs and discussed the procedure including the risks, benefits and alternatives for the proposed anesthesia with the patient  or authorized representative who has indicated his/her understanding and acceptance.       Plan Discussed with: CRNA and Anesthesiologist  Anesthesia Plan Comments:         Anesthesia Quick Evaluation

## 2023-11-01 NOTE — CV Procedure (Signed)
Procedure: Electrical Cardioversion Indications:  Atrial Fibrillation  Procedure Details:  Consent: Risks of procedure as well as the alternatives and risks of each were explained to the (patient/caregiver).  Consent for procedure obtained.  Time Out: Verified patient identification, verified procedure, site/side was marked, verified correct patient position, special equipment/implants available, medications/allergies/relevent history reviewed, required imaging and test results available. PERFORMED.  Patient placed on cardiac monitor, pulse oximetry, supplemental oxygen as necessary.  Sedation given:  propofol per anesthesia Pacer pads placed anterior chest.  Cardioverted 1 time(s).  Cardioversion with synchronized biphasic 200J shock.  Evaluation: Findings: Post procedure EKG shows: NSR Complications: None Patient did tolerate procedure well.  Time Spent Directly with the Patient:  20 minutes   Parke Poisson 11/01/2023, 12:57 PM

## 2023-11-01 NOTE — Discharge Instructions (Signed)

## 2023-11-01 NOTE — Interval H&P Note (Signed)
 History and Physical Interval Note:  11/01/2023 12:44 PM  Nancy Avila  has presented today for surgery, with the diagnosis of afib.  The various methods of treatment have been discussed with the patient and family. After consideration of risks, benefits and other options for treatment, the patient has consented to  Procedure(s): CARDIOVERSION (N/A) as a surgical intervention.  The patient's history has been reviewed, patient examined, no change in status, stable for surgery.  I have reviewed the patient's chart and labs.  Questions were answered to the patient's satisfaction.     [name] A Abdulrahim Siddiqi

## 2023-11-01 NOTE — Transfer of Care (Signed)
 Immediate Anesthesia Transfer of Care Note  Patient: Nancy Avila  Procedure(s) Performed: CARDIOVERSION  Patient Location: PACU  Anesthesia Type:MAC  Level of Consciousness: sedated  Airway & Oxygen Therapy: Patient Spontanous Breathing  Post-op Assessment: Report given to RN  Post vital signs: Reviewed and stable  Last Vitals:  Vitals Value Taken Time  BP 108/87 11/01/23 1248  Temp    Pulse 84 11/01/23 1249  Resp 17 11/01/23 1249  SpO2 95 % 11/01/23 1249  Vitals shown include unfiled device data.  Last Pain: There were no vitals filed for this visit.       Complications: No notable events documented.

## 2023-11-01 NOTE — Anesthesia Postprocedure Evaluation (Signed)
 Anesthesia Post Note  Patient: Nancy Avila  Procedure(s) Performed: CARDIOVERSION     Patient location during evaluation: PACU Anesthesia Type: General Level of consciousness: awake and alert Pain management: pain level controlled Vital Signs Assessment: post-procedure vital signs reviewed and stable Respiratory status: spontaneous breathing, nonlabored ventilation and respiratory function stable Cardiovascular status: stable and blood pressure returned to baseline Anesthetic complications: no   No notable events documented.  Last Vitals:  Vitals:   11/01/23 1311 11/01/23 1321  BP: 110/77 (!) 110/57  Pulse: 72 70  Resp: 17 14  Temp:    SpO2: 94% 95%    Last Pain:  Vitals:   11/01/23 1321  TempSrc:   PainSc: 0-No pain                 Debby FORBES Like

## 2023-11-01 NOTE — Anesthesia Procedure Notes (Signed)
 Procedure Name: MAC Date/Time: 11/01/2023 12:52 PM  Performed by: Crisoforo Burnard KIDD, CRNAPre-anesthesia Checklist: Patient identified Patient Re-evaluated:Patient Re-evaluated prior to induction Preoxygenation: Pre-oxygenation with 100% oxygen Induction Type: IV induction Placement Confirmation: positive ETCO2 Dental Injury: Teeth and Oropharynx as per pre-operative assessment

## 2023-11-02 ENCOUNTER — Encounter (HOSPITAL_COMMUNITY): Payer: Self-pay | Admitting: Internal Medicine

## 2023-11-15 ENCOUNTER — Ambulatory Visit (HOSPITAL_COMMUNITY): Payer: PPO | Admitting: Internal Medicine

## 2023-11-17 ENCOUNTER — Ambulatory Visit (HOSPITAL_COMMUNITY): Payer: PPO | Admitting: Internal Medicine

## 2023-11-24 ENCOUNTER — Ambulatory Visit (HOSPITAL_COMMUNITY)
Admission: RE | Admit: 2023-11-24 | Discharge: 2023-11-24 | Disposition: A | Discharge status: Home / Self Care | Payer: HMO | Source: Ambulatory Visit | Attending: Internal Medicine | Admitting: Internal Medicine

## 2023-11-24 ENCOUNTER — Other Ambulatory Visit (HOSPITAL_COMMUNITY): Payer: Self-pay | Admitting: Internal Medicine

## 2023-11-24 VITALS — BP 108/62 | HR 83 | Ht 68.5 in | Wt 172.6 lb

## 2023-11-24 DIAGNOSIS — Z79899 Other long term (current) drug therapy: Secondary | ICD-10-CM | POA: Insufficient documentation

## 2023-11-24 DIAGNOSIS — Z87891 Personal history of nicotine dependence: Secondary | ICD-10-CM | POA: Diagnosis not present

## 2023-11-24 DIAGNOSIS — F419 Anxiety disorder, unspecified: Secondary | ICD-10-CM | POA: Insufficient documentation

## 2023-11-24 DIAGNOSIS — D6869 Other thrombophilia: Secondary | ICD-10-CM | POA: Diagnosis not present

## 2023-11-24 DIAGNOSIS — J449 Chronic obstructive pulmonary disease, unspecified: Secondary | ICD-10-CM | POA: Diagnosis not present

## 2023-11-24 DIAGNOSIS — Z7901 Long term (current) use of anticoagulants: Secondary | ICD-10-CM | POA: Diagnosis not present

## 2023-11-24 DIAGNOSIS — I4819 Other persistent atrial fibrillation: Secondary | ICD-10-CM | POA: Insufficient documentation

## 2023-11-24 MED ORDER — AMIODARONE HCL 200 MG PO TABS: ORAL_TABLET | ORAL | 1 refills | Status: DC

## 2023-11-24 NOTE — Progress Notes (Signed)
 Primary Care Physician: Richmond Campbell., PA-C Primary Cardiologist: Verne Carrow, MD Electrophysiologist: None     Referring Physician: Dr. Nash Shearer Guise is a 83 y.o. female with a history of anxiety, COPD, former tobacco use, and paroxsymal atrial fibrillation who presents for consultation in the [facility]. Seen by Dr. Clifton James on 09/07/23 for new Afib noted by PCP. Toprol increased to 25 mg daily. Patient is on Eliquis 5 mg BID for a CHADS2VASC score of 3.  On evaluation today, she is currently in Afib. She was unable to tolerate Toprol 25 mg daily due to hypotension and not feeling well. She has remained on Toprol 12.5 mg daily. No missed doses of Eliquis; she took 2 doses on 12/12. She maybe feels more SOB but not sure if it's Afib or her COPD.    On follow up 11/24/23, she is currently in Afib. S/p successful DCCV on 11/01/23. No missed doses of Eliquis 5 mg BID. She felt good after cardioversion until a few days ago.   Today, she denies symptoms of palpitations, chest pain, orthopnea, PND, lower extremity edema, dizziness, presyncope, syncope, snoring, daytime somnolence, bleeding, or neurologic sequela. The patient is tolerating medications without difficulties and is otherwise without complaint today.    she has a BMI of Body mass index is 25.86 kg/m.Marland Kitchen Filed Weights   11/24/23 0856  Weight: 78.3 kg     Current Outpatient Medications  Medication Sig Dispense Refill   albuterol (VENTOLIN HFA) 108 (90 Base) MCG/ACT inhaler Inhale 1-2 puffs into the lungs every 6 (six) hours as needed for wheezing or shortness of breath.     apixaban (ELIQUIS) 5 MG TABS tablet Take 1 tablet (5 mg total) by mouth 2 (two) times daily. 60 tablet 5   Ascorbic Acid (VITAMIN C) 1000 MG tablet Take 1,000 mg by mouth 3 (three) times daily.     Cholecalciferol (VITAMIN D3) 75 MCG (3000 UT) TABS Take 3,000 Units by mouth daily.     clonazePAM  (KLONOPIN) 0.5 MG tablet Take 0.25 mg by mouth as needed for anxiety.     fluticasone (FLONASE) 50 MCG/ACT nasal spray Place 2 sprays into both nostrils daily as needed for allergies.     HAWTHORN PO Take 425 mg by mouth 3 (three) times daily.     Magnesium 300 MG CAPS Take 300 mg by mouth 3 (three) times daily.     metoprolol succinate (TOPROL XL) 25 MG 24 hr tablet Take 1 tablet (25 mg total) by mouth daily. (Patient taking differently: Take 12.5 mg by mouth daily.) 90 tablet 3   Misc Natural Products (GINSENG COMPLEX PO) Take 1 capsule by mouth daily.     Multiple Vitamin (MULTIVITAMIN WITH MINERALS) TABS tablet Take 1 tablet by mouth daily.     Multiple Vitamins-Minerals (HAIR SKIN NAILS PO) Take 1 capsule by mouth daily.     Omega-3 Fatty Acids (OMEGA 3 PO) Take 2 capsules by mouth daily.     OVER THE COUNTER MEDICATION Take 2 tablets by mouth daily. Memory and Brain supplement     OVER THE COUNTER MEDICATION 1 Dose See admin instructions. Sovereign Silver immune support SL liquid  Take 1 dropper full under the tongue every other day, may inhale in 3 to 4 droppers full as a nebulizing solution as needed for shortness of breath     Probiotic Product (PROBIOTIC PO) Take 1 capsule by mouth daily.  TRELEGY ELLIPTA 100-62.5-25 MCG/ACT AEPB Inhale 1 puff into the lungs daily.     TURMERIC PO Take 400 mg by mouth daily.     vitamin B-12 (CYANOCOBALAMIN) 500 MCG tablet Take 500 mcg by mouth daily.     VITAMIN D-VITAMIN K PO Take 1 capsule by mouth daily.     Zinc 30 MG CAPS Take 30 mg by mouth daily.     No current facility-administered medications for this encounter.    Atrial Fibrillation Management history:  Previous antiarrhythmic drugs: none Previous cardioversions: 11/01/23 Previous ablations: none Anticoagulation history: Eliquis   ROS- All systems are reviewed and negative except as per the HPI above.  Physical Exam: BP 108/62   Pulse 83   Ht 5' 8.5" (1.74 m)   Wt 78.3 kg    BMI 25.86 kg/m   GEN: Well nourished, well developed in no acute distress NECK: No JVD; No carotid bruits CARDIAC: Irregularly irregular rate and rhythm, no murmurs, rubs, gallops RESPIRATORY:  Clear to auscultation without rales, wheezing or rhonchi  ABDOMEN: Soft, non-tender, non-distended EXTREMITIES:  No edema; No deformity   EKG today demonstrates  Vent. rate 83 BPM PR interval * ms QRS duration 84 ms QT/QTcB 360/423 ms P-R-T axes * 9 70 Atrial fibrillation Nonspecific ST and T wave abnormality Abnormal ECG When compared with ECG of 25-Oct-2023 11:17, PREVIOUS ECG IS PRESENT  Echo 10/17/23 demonstrated  1. Left ventricular ejection fraction, by estimation, is 60 to 65%. The  left ventricle has normal function. The left ventricle has no regional  wall motion abnormalities. Left ventricular diastolic function could not  be evaluated.   2. Right ventricular systolic function is normal. The right ventricular  size is normal. There is normal pulmonary artery systolic pressure.   3. Left atrial size was moderately dilated.   4. Right atrial size was mildly dilated.   5. The mitral valve is normal in structure. Moderate mitral valve  regurgitation likely related to atrial dilation. No evidence of mitral  stenosis.   6. The aortic valve is normal in structure. Aortic valve regurgitation is  mild. No aortic stenosis is present.   7. Aortic dilatation noted. There is borderline dilatation of the  ascending aorta, measuring 39 mm.   8. The inferior vena cava is normal in size with greater than 50%  respiratory variability, suggesting right atrial pressure of 3 mmHg.    ASSESSMENT & PLAN CHA2DS2-VASc Score = 3  The patient's score is based upon: CHF History: 0 HTN History: 0 Diabetes History: 0 Stroke History: 0 Vascular Disease History: 0 Age Score: 2 Gender Score: 1       ASSESSMENT AND PLAN: Persistent Atrial Fibrillation (ICD10:  I48.19) The patient's CHA2DS2-VASc  score is 3, indicating a 3.2% annual risk of stroke.   S/p successful DCCV on 11/01/23.  She is currently in Afib. She has had ERAF and does not feel well when out of rhythm. We had a lengthy discussion about medication treatments and ablation in detail. We discussed potential options such as Multaq, flecainide, Tikosyn, and amiodarone. We talked about the monitoring required for these medications, hospital admission for Tikosyn, and potential adverse effects. We also discussed pulse field ablation as an option in the form of intervention. After discussion, patient is interested in beginning amiodarone. Will begin amiodarone load of 200 mg BID x 4 weeks. She will follow up with me in 3 weeks to determine if needs a repeat cardioversion.    Secondary Hypercoagulable State (ICD10:  J19.14) The patient is at significant risk for stroke/thromboembolism based upon her CHA2DS2-VASc Score of 3.  Continue Apixaban (Eliquis).  Continue without interruption.    Follow up 3 weeks Afib clinic.    Lake Bells, PA-C  Afib Clinic [facility] [address] [place], [place] [address] [phone]

## 2023-11-24 NOTE — Patient Instructions (Signed)
 Amiodarone 200mg  twice a day for 30 days then reduce to once a day take with food

## 2023-12-15 ENCOUNTER — Ambulatory Visit (HOSPITAL_COMMUNITY)
Admission: RE | Admit: 2023-12-15 | Discharge: 2023-12-15 | Disposition: A | Discharge status: Home / Self Care | Payer: HMO | Source: Ambulatory Visit | Attending: Internal Medicine | Admitting: Internal Medicine

## 2023-12-15 VITALS — BP 130/66 | HR 51 | Ht 68.5 in | Wt 173.0 lb

## 2023-12-15 DIAGNOSIS — J449 Chronic obstructive pulmonary disease, unspecified: Secondary | ICD-10-CM | POA: Insufficient documentation

## 2023-12-15 DIAGNOSIS — Z87891 Personal history of nicotine dependence: Secondary | ICD-10-CM | POA: Diagnosis not present

## 2023-12-15 DIAGNOSIS — Z5181 Encounter for therapeutic drug level monitoring: Secondary | ICD-10-CM | POA: Diagnosis not present

## 2023-12-15 DIAGNOSIS — Z79899 Other long term (current) drug therapy: Secondary | ICD-10-CM | POA: Diagnosis not present

## 2023-12-15 DIAGNOSIS — Z7901 Long term (current) use of anticoagulants: Secondary | ICD-10-CM | POA: Diagnosis not present

## 2023-12-15 DIAGNOSIS — D6869 Other thrombophilia: Secondary | ICD-10-CM | POA: Diagnosis not present

## 2023-12-15 DIAGNOSIS — I4819 Other persistent atrial fibrillation: Secondary | ICD-10-CM

## 2023-12-15 NOTE — Progress Notes (Signed)
 Primary Care Physician: Richmond Campbell., PA-C Primary Cardiologist: Verne Carrow, MD Electrophysiologist: None     Referring Physician: Dr. Nash Shearer Nancy Avila is a 83 y.o. female with a history of anxiety, COPD, former tobacco use, and paroxsymal atrial fibrillation who presents for consultation in the [facility]. Seen by Dr. Clifton James on 09/07/23 for new Afib noted by PCP. Toprol increased to 25 mg daily. Patient is on Eliquis 5 mg BID for a CHADS2VASC score of 3.  On evaluation today, she is currently in Afib. She was unable to tolerate Toprol 25 mg daily due to hypotension and not feeling well. She has remained on Toprol 12.5 mg daily. No missed doses of Eliquis; she took 2 doses on 12/12. She maybe feels more SOB but not sure if it's Afib or her COPD.    On follow up 11/24/23, she is currently in Afib. S/p successful DCCV on 11/01/23. No missed doses of Eliquis 5 mg BID. She felt good after cardioversion until a few days ago.   On follow up 12/15/23, she is currently in NSR. She began amiodarone load of 200 mg BID x 4 weeks at last visit due to ERAF. She overall feels better but does note at times to feel tired. No missed doses of Eliquis. She is taking Toprol 12.5 mg.   Today, she denies symptoms of palpitations, chest pain, orthopnea, PND, lower extremity edema, dizziness, presyncope, syncope, snoring, daytime somnolence, bleeding, or neurologic sequela. The patient is tolerating medications without difficulties and is otherwise without complaint today.    she has a BMI of Body mass index is 25.92 kg/m.Marland Kitchen Filed Weights   12/15/23 0904  Weight: 78.5 kg    Current Outpatient Medications  Medication Sig Dispense Refill   albuterol (VENTOLIN HFA) 108 (90 Base) MCG/ACT inhaler Inhale 1-2 puffs into the lungs every 6 (six) hours as needed for wheezing or shortness of breath.     amiodarone (PACERONE) 200 MG tablet TAKE 1 TAB BY MOUTH 2  (TWO) TIMES DAILY FOR 30 DAYS, THEN 1 TABLET (200 MG TOTAL) DAILY. 180 tablet 1   apixaban (ELIQUIS) 5 MG TABS tablet Take 1 tablet (5 mg total) by mouth 2 (two) times daily. 60 tablet 5   Ascorbic Acid (VITAMIN C) 1000 MG tablet Take 1,000 mg by mouth 3 (three) times daily.     Cholecalciferol (VITAMIN D3) 75 MCG (3000 UT) TABS Take 3,000 Units by mouth daily.     clonazePAM (KLONOPIN) 0.5 MG tablet Take 0.25 mg by mouth as needed for anxiety.     fluticasone (FLONASE) 50 MCG/ACT nasal spray Place 2 sprays into both nostrils daily as needed for allergies.     HAWTHORN PO Take 425 mg by mouth 3 (three) times daily.     Magnesium 300 MG CAPS Take 300 mg by mouth 3 (three) times daily.     metoprolol succinate (TOPROL XL) 25 MG 24 hr tablet Take 1 tablet (25 mg total) by mouth daily. (Patient taking differently: Take 12.5 mg by mouth daily.) 90 tablet 3   Misc Natural Products (GINSENG COMPLEX PO) Take 1 capsule by mouth daily.     Multiple Vitamin (MULTIVITAMIN WITH MINERALS) TABS tablet Take 1 tablet by mouth daily.     Multiple Vitamins-Minerals (HAIR SKIN NAILS PO) Take 1 capsule by mouth daily.     Omega-3 Fatty Acids (OMEGA 3 PO) Take 2 capsules by mouth daily.     OVER  THE COUNTER MEDICATION Take 2 tablets by mouth daily. Memory and Brain supplement     OVER THE COUNTER MEDICATION 1 Dose See admin instructions. Sovereign Silver immune support SL liquid  Take 1 dropper full under the tongue every other day, may inhale in 3 to 4 droppers full as a nebulizing solution as needed for shortness of breath     Probiotic Product (PROBIOTIC PO) Take 1 capsule by mouth daily.     TRELEGY ELLIPTA 100-62.5-25 MCG/ACT AEPB Inhale 1 puff into the lungs daily.     TURMERIC PO Take 400 mg by mouth daily.     vitamin B-12 (CYANOCOBALAMIN) 500 MCG tablet Take 500 mcg by mouth daily.     VITAMIN D-VITAMIN K PO Take 1 capsule by mouth daily.     Zinc 30 MG CAPS Take 30 mg by mouth daily.     No current  facility-administered medications for this encounter.    Atrial Fibrillation Management history:  Previous antiarrhythmic drugs: none Previous cardioversions: 11/01/23 Previous ablations: none Anticoagulation history: Eliquis   ROS- All systems are reviewed and negative except as per the HPI above.  Physical Exam: BP 130/66   Pulse (!) 51   Ht 5' 8.5" (1.74 m)   Wt 78.5 kg   BMI 25.92 kg/m   GEN- The patient is well appearing, alert and oriented x 3 today.   Neck - no JVD or carotid bruit noted Lungs- Clear to ausculation bilaterally, normal work of breathing Heart- Regular bradycardic rate and rhythm, no murmurs, rubs or gallops, PMI not laterally displaced Extremities- no clubbing, cyanosis, or edema Skin - no rash or ecchymosis noted   EKG today demonstrates  Vent. rate 51 BPM PR interval 236 ms QRS duration 96 ms QT/QTcB 448/412 ms P-R-T axes 54 -19 58 Sinus bradycardia with 1st degree A-V block Cannot rule out Anterior infarct , age undetermined Abnormal ECG When compared with ECG of 24-Nov-2023 08:59, PREVIOUS ECG IS PRESENT  Echo 10/17/23 demonstrated  1. Left ventricular ejection fraction, by estimation, is 60 to 65%. The  left ventricle has normal function. The left ventricle has no regional  wall motion abnormalities. Left ventricular diastolic function could not  be evaluated.   2. Right ventricular systolic function is normal. The right ventricular  size is normal. There is normal pulmonary artery systolic pressure.   3. Left atrial size was moderately dilated.   4. Right atrial size was mildly dilated.   5. The mitral valve is normal in structure. Moderate mitral valve  regurgitation likely related to atrial dilation. No evidence of mitral  stenosis.   6. The aortic valve is normal in structure. Aortic valve regurgitation is  mild. No aortic stenosis is present.   7. Aortic dilatation noted. There is borderline dilatation of the  ascending aorta,  measuring 39 mm.   8. The inferior vena cava is normal in size with greater than 50%  respiratory variability, suggesting right atrial pressure of 3 mmHg.    ASSESSMENT & PLAN CHA2DS2-VASc Score = 3  The patient's score is based upon: CHF History: 0 HTN History: 0 Diabetes History: 0 Stroke History: 0 Vascular Disease History: 0 Age Score: 2 Gender Score: 1       ASSESSMENT AND PLAN: Persistent Atrial Fibrillation (ICD10:  I48.19) The patient's CHA2DS2-VASc score is 3, indicating a 3.2% annual risk of stroke.   S/p successful DCCV on 11/01/23.  She is currently in NSR. She began amiodarone 200 mg BID load at last  visit. Once she transitions to once daily amiodarone, recommended to take Toprol in the evening.   High risk medication monitoring (ICD10: R7229428) Patient requires ongoing monitoring for anti-arrhythmic medication which has the potential to cause life threatening arrhythmias or AV block. Qtc stable. Continue amiodarone 200 mg BID and transition to 200 mg once daily on 3/29.   Secondary Hypercoagulable State (ICD10:  D68.69) The patient is at significant risk for stroke/thromboembolism based upon her CHA2DS2-VASc Score of 3.  Continue Apixaban (Eliquis).  Continue without interruption.    Follow up 3 months for amiodarone surveillance.    Lake Bells, PA-C  Afib Clinic [facility] [address] [place], [place] [address] [phone]

## 2024-03-15 ENCOUNTER — Ambulatory Visit (HOSPITAL_COMMUNITY)
Admission: RE | Admit: 2024-03-15 | Discharge: 2024-03-15 | Disposition: A | Discharge status: Home / Self Care | Payer: PRIVATE HEALTH INSURANCE | Source: Ambulatory Visit | Attending: Internal Medicine | Admitting: Internal Medicine

## 2024-03-15 VITALS — BP 160/70 | HR 54 | Ht 68.5 in | Wt 183.6 lb

## 2024-03-15 DIAGNOSIS — D6869 Other thrombophilia: Secondary | ICD-10-CM

## 2024-03-15 DIAGNOSIS — I4819 Other persistent atrial fibrillation: Secondary | ICD-10-CM | POA: Diagnosis not present

## 2024-03-15 DIAGNOSIS — Z79899 Other long term (current) drug therapy: Secondary | ICD-10-CM

## 2024-03-15 DIAGNOSIS — I4891 Unspecified atrial fibrillation: Secondary | ICD-10-CM

## 2024-03-15 DIAGNOSIS — Z5181 Encounter for therapeutic drug level monitoring: Secondary | ICD-10-CM | POA: Diagnosis not present

## 2024-03-15 NOTE — Progress Notes (Signed)
 Primary Care Physician: Lory Rough., PA-C Primary Cardiologist: Antoinette Batman, MD Electrophysiologist: None     Referring Physician: Dr. Asuncion Layer Zuch is a 83 y.o. female with a history of anxiety, COPD, former tobacco use, and paroxsymal atrial fibrillation who presents for consultation in the [facility]. Seen by Dr. Abel Hoe on 09/07/23 for new Afib noted by PCP. Toprol  increased to 25 mg daily. Patient is on Eliquis  5 mg BID for a CHADS2VASC score of 3.  On evaluation today, she is currently in Afib. She was unable to tolerate Toprol  25 mg daily due to hypotension and not feeling well. She has remained on Toprol  12.5 mg daily. No missed doses of Eliquis ; she took 2 doses on 12/12. She maybe feels more SOB but not sure if it's Afib or her COPD.    On follow up 11/24/23, she is currently in Afib. S/p successful DCCV on 11/01/23. No missed doses of Eliquis  5 mg BID. She felt good after cardioversion until a few days ago.   On follow up 12/15/23, she is currently in NSR. She began amiodarone  load of 200 mg BID x 4 weeks at last visit due to ERAF. She overall feels better but does note at times to feel tired. No missed doses of Eliquis . She is taking Toprol  12.5 mg.   On follow up 03/15/24, she is currently in NSR. She is here for amiodarone  surveillance. She has had no Afib burden since last office visit. No missed doses of Eliquis .  Today, she denies symptoms of palpitations, chest pain, orthopnea, PND, lower extremity edema, dizziness, presyncope, syncope, snoring, daytime somnolence, bleeding, or neurologic sequela. The patient is tolerating medications without difficulties and is otherwise without complaint today.    she has a BMI of Body mass index is 27.51 kg/m.Aaron Aas Filed Weights   03/15/24 0855  Weight: 83.3 kg     Current Outpatient Medications  Medication Sig Dispense Refill   albuterol (VENTOLIN HFA) 108 (90 Base)  MCG/ACT inhaler Inhale 1-2 puffs into the lungs every 6 (six) hours as needed for wheezing or shortness of breath.     amiodarone  (PACERONE ) 200 MG tablet TAKE 1 TAB BY MOUTH 2 (TWO) TIMES DAILY FOR 30 DAYS, THEN 1 TABLET (200 MG TOTAL) DAILY. (Patient taking differently: Take 200 mg by mouth daily.) 180 tablet 1   apixaban  (ELIQUIS ) 5 MG TABS tablet Take 1 tablet (5 mg total) by mouth 2 (two) times daily. 60 tablet 5   Ascorbic Acid (VITAMIN C) 1000 MG tablet Take 1,000 mg by mouth 3 (three) times daily.     Cholecalciferol (VITAMIN D3) 75 MCG (3000 UT) TABS Take 3,000 Units by mouth daily.     clonazePAM (KLONOPIN) 0.5 MG tablet Take 0.25 mg by mouth as needed for anxiety.     fluticasone (FLONASE) 50 MCG/ACT nasal spray Place 2 sprays into both nostrils daily as needed for allergies.     HAWTHORN PO Take 425 mg by mouth 3 (three) times daily.     Magnesium 300 MG CAPS Take 300 mg by mouth 3 (three) times daily.     metoprolol  succinate (TOPROL  XL) 25 MG 24 hr tablet Take 1 tablet (25 mg total) by mouth daily. 90 tablet 3   Misc Natural Products (GINSENG COMPLEX PO) Take 1 capsule by mouth daily.     Multiple Vitamin (MULTIVITAMIN WITH MINERALS) TABS tablet Take 1 tablet by mouth daily.     Multiple  Vitamins-Minerals (HAIR SKIN NAILS PO) Take 1 capsule by mouth daily.     Omega-3 Fatty Acids (OMEGA 3 PO) Take 2 capsules by mouth daily.     OVER THE COUNTER MEDICATION Take 2 tablets by mouth daily. Memory and Brain supplement     OVER THE COUNTER MEDICATION 1 Dose See admin instructions. Sovereign Silver immune support SL liquid  Take 1 dropper full under the tongue every other day, may inhale in 3 to 4 droppers full as a nebulizing solution as needed for shortness of breath     Probiotic Product (PROBIOTIC PO) Take 1 capsule by mouth daily.     TRELEGY ELLIPTA 100-62.5-25 MCG/ACT AEPB Inhale 1 puff into the lungs daily.     TURMERIC PO Take 400 mg by mouth daily.     vitamin B-12  (CYANOCOBALAMIN) 500 MCG tablet Take 500 mcg by mouth daily.     VITAMIN D-VITAMIN K PO Take 1 capsule by mouth daily.     Zinc 30 MG CAPS Take 30 mg by mouth daily.     No current facility-administered medications for this encounter.    Atrial Fibrillation Management history:  Previous antiarrhythmic drugs: amiodarone  Previous cardioversions: 11/01/23 Previous ablations: none Anticoagulation history: Eliquis    ROS- All systems are reviewed and negative except as per the HPI above.  Physical Exam: BP (!) 160/70   Pulse (!) 54   Ht 5' 8.5 (1.74 m)   Wt 83.3 kg   BMI 27.51 kg/m   GEN- The patient is well appearing, alert and oriented x 3 today.   Neck - no JVD or carotid bruit noted Lungs- Clear to ausculation bilaterally, normal work of breathing Heart- Regular rate and rhythm, no murmurs, rubs or gallops, PMI not laterally displaced Extremities- no clubbing, cyanosis, or edema Skin - no rash or ecchymosis noted   EKG today demonstrates  Vent. rate 54 BPM PR interval 214 ms QRS duration 96 ms QT/QTcB 426/403 ms P-R-T axes 59 -17 67 Sinus bradycardia with 1st degree A-V block Otherwise normal ECG When compared with ECG of 15-Dec-2023 09:19, No significant change was found  Echo 10/17/23 demonstrated  1. Left ventricular ejection fraction, by estimation, is 60 to 65%. The  left ventricle has normal function. The left ventricle has no regional  wall motion abnormalities. Left ventricular diastolic function could not  be evaluated.   2. Right ventricular systolic function is normal. The right ventricular  size is normal. There is normal pulmonary artery systolic pressure.   3. Left atrial size was moderately dilated.   4. Right atrial size was mildly dilated.   5. The mitral valve is normal in structure. Moderate mitral valve  regurgitation likely related to atrial dilation. No evidence of mitral  stenosis.   6. The aortic valve is normal in structure. Aortic valve  regurgitation is  mild. No aortic stenosis is present.   7. Aortic dilatation noted. There is borderline dilatation of the  ascending aorta, measuring 39 mm.   8. The inferior vena cava is normal in size with greater than 50%  respiratory variability, suggesting right atrial pressure of 3 mmHg.    ASSESSMENT & PLAN CHA2DS2-VASc Score = 3  The patient's score is based upon: CHF History: 0 HTN History: 0 Diabetes History: 0 Stroke History: 0 Vascular Disease History: 0 Age Score: 2 Gender Score: 1       ASSESSMENT AND PLAN: Persistent Atrial Fibrillation (ICD10:  I48.19) The patient's CHA2DS2-VASc score is 3, indicating a 3.2%  annual risk of stroke.   S/p successful DCCV on 11/01/23.  She is currently in NSR.  High risk medication monitoring (ICD10: [id number]) Patient requires ongoing monitoring for anti-arrhythmic medication which has the potential to cause life threatening arrhythmias or AV block. Qtc stable. Continue amiodarone  200 mg daily. Cmet and TSH drawn today.   Secondary Hypercoagulable State (ICD10:  D68.69) The patient is at significant risk for stroke/thromboembolism based upon her CHA2DS2-VASc Score of 3.  Continue Apixaban  (Eliquis ).  Continue Eliquis  5 mg BID without interruption.    Follow up 6 months for amiodarone  surveillance.    Minnie Amber, PA-C  Afib Clinic [facility] [address] [place], [place] [address] [phone]

## 2024-03-16 ENCOUNTER — Ambulatory Visit (HOSPITAL_COMMUNITY): Payer: Self-pay | Admitting: Internal Medicine

## 2024-03-16 LAB — COMPREHENSIVE METABOLIC PANEL WITH GFR
ALT: 27 IU/L (ref 0–32)
AST: 27 IU/L (ref 0–40)
Albumin: 4.2 g/dL (ref 3.7–4.7)
Alkaline Phosphatase: 57 IU/L (ref 44–121)
BUN/Creatinine Ratio: 18 (ref 12–28)
BUN: 14 mg/dL (ref 8–27)
Bilirubin Total: 0.4 mg/dL (ref 0.0–1.2)
CO2: 23 mmol/L (ref 20–29)
Calcium: 9.7 mg/dL (ref 8.7–10.3)
Chloride: 100 mmol/L (ref 96–106)
Creatinine, Ser: 0.8 mg/dL (ref 0.57–1.00)
Globulin, Total: 2.1 g/dL (ref 1.5–4.5)
Glucose: 86 mg/dL (ref 70–99)
Potassium: 5 mmol/L (ref 3.5–5.2)
Sodium: 138 mmol/L (ref 134–144)
Total Protein: 6.3 g/dL (ref 6.0–8.5)
eGFR: 74 mL/min/{1.73_m2} (ref >59)

## 2024-03-16 LAB — TSH: TSH: 0.959 u[IU]/mL (ref 0.450–4.500)

## 2024-03-21 ENCOUNTER — Other Ambulatory Visit: Payer: Self-pay

## 2024-03-21 MED ORDER — APIXABAN 5 MG PO TABS: 5.0000 mg | ORAL_TABLET | Freq: Two times a day (BID) | ORAL | 5 refills | Status: DC

## 2024-03-21 NOTE — Telephone Encounter (Signed)
 Prescription refill request for Eliquis  received. Indication:AFIB Last office visit:6/25 Scr:0.80  6/25 Age: 83 Weight:83.3  kg  Prescription refilled

## 2024-08-10 ENCOUNTER — Inpatient Hospital Stay (HOSPITAL_COMMUNITY)
Admission: EM | Admit: 2024-08-10 | Discharge: 2024-08-15 | DRG: 522 | Disposition: A | Discharge status: Skilled Nursing Facility | Payer: PRIVATE HEALTH INSURANCE | Attending: Emergency Medicine | Admitting: Emergency Medicine

## 2024-08-10 ENCOUNTER — Emergency Department (HOSPITAL_COMMUNITY): Payer: PRIVATE HEALTH INSURANCE

## 2024-08-10 ENCOUNTER — Encounter (HOSPITAL_COMMUNITY): Payer: Self-pay | Admitting: Emergency Medicine

## 2024-08-10 ENCOUNTER — Other Ambulatory Visit: Payer: Self-pay

## 2024-08-10 DIAGNOSIS — Z883 Allergy status to other anti-infective agents status: Secondary | ICD-10-CM

## 2024-08-10 DIAGNOSIS — Z90710 Acquired absence of both cervix and uterus: Secondary | ICD-10-CM | POA: Diagnosis not present

## 2024-08-10 DIAGNOSIS — E781 Pure hyperglyceridemia: Secondary | ICD-10-CM | POA: Diagnosis present

## 2024-08-10 DIAGNOSIS — S72002A Fracture of unspecified part of neck of left femur, initial encounter for closed fracture: Principal | ICD-10-CM | POA: Diagnosis present

## 2024-08-10 DIAGNOSIS — Z79899 Other long term (current) drug therapy: Secondary | ICD-10-CM | POA: Diagnosis not present

## 2024-08-10 DIAGNOSIS — Z96641 Presence of right artificial hip joint: Secondary | ICD-10-CM | POA: Diagnosis present

## 2024-08-10 DIAGNOSIS — R0902 Hypoxemia: Secondary | ICD-10-CM | POA: Diagnosis present

## 2024-08-10 DIAGNOSIS — J438 Other emphysema: Secondary | ICD-10-CM | POA: Diagnosis not present

## 2024-08-10 DIAGNOSIS — S72002K Fracture of unspecified part of neck of left femur, subsequent encounter for closed fracture with nonunion: Secondary | ICD-10-CM | POA: Diagnosis not present

## 2024-08-10 DIAGNOSIS — Z87891 Personal history of nicotine dependence: Secondary | ICD-10-CM | POA: Diagnosis not present

## 2024-08-10 DIAGNOSIS — Z800 Family history of malignant neoplasm of digestive organs: Secondary | ICD-10-CM

## 2024-08-10 DIAGNOSIS — I4819 Other persistent atrial fibrillation: Secondary | ICD-10-CM | POA: Diagnosis present

## 2024-08-10 DIAGNOSIS — Z7901 Long term (current) use of anticoagulants: Secondary | ICD-10-CM

## 2024-08-10 DIAGNOSIS — Z885 Allergy status to narcotic agent status: Secondary | ICD-10-CM | POA: Diagnosis not present

## 2024-08-10 DIAGNOSIS — E869 Volume depletion, unspecified: Secondary | ICD-10-CM | POA: Diagnosis present

## 2024-08-10 DIAGNOSIS — D6869 Other thrombophilia: Secondary | ICD-10-CM | POA: Diagnosis present

## 2024-08-10 DIAGNOSIS — Z888 Allergy status to other drugs, medicaments and biological substances status: Secondary | ICD-10-CM | POA: Diagnosis not present

## 2024-08-10 DIAGNOSIS — J449 Chronic obstructive pulmonary disease, unspecified: Secondary | ICD-10-CM | POA: Diagnosis present

## 2024-08-10 DIAGNOSIS — E876 Hypokalemia: Secondary | ICD-10-CM | POA: Diagnosis present

## 2024-08-10 DIAGNOSIS — W1839XA Other fall on same level, initial encounter: Secondary | ICD-10-CM | POA: Diagnosis present

## 2024-08-10 DIAGNOSIS — Z8249 Family history of ischemic heart disease and other diseases of the circulatory system: Secondary | ICD-10-CM

## 2024-08-10 DIAGNOSIS — Y92008 Other place in unspecified non-institutional (private) residence as the place of occurrence of the external cause: Secondary | ICD-10-CM

## 2024-08-10 DIAGNOSIS — Z01810 Encounter for preprocedural cardiovascular examination: Secondary | ICD-10-CM | POA: Diagnosis not present

## 2024-08-10 DIAGNOSIS — E871 Hypo-osmolality and hyponatremia: Secondary | ICD-10-CM | POA: Diagnosis present

## 2024-08-10 DIAGNOSIS — Z811 Family history of alcohol abuse and dependence: Secondary | ICD-10-CM | POA: Diagnosis not present

## 2024-08-10 LAB — COMPREHENSIVE METABOLIC PANEL WITH GFR
ALT: 49 U/L — ABNORMAL HIGH (ref 0–44)
AST: 44 U/L — ABNORMAL HIGH (ref 15–41)
Albumin: 4.4 g/dL (ref 3.5–5.0)
Alkaline Phosphatase: 60 U/L (ref 38–126)
Anion gap: 10 (ref 5–15)
BUN: 16 mg/dL (ref 8–23)
CO2: 26 mmol/L (ref 22–32)
Calcium: 9.2 mg/dL (ref 8.9–10.3)
Chloride: 96 mmol/L — ABNORMAL LOW (ref 98–111)
Creatinine, Ser: 0.8 mg/dL (ref 0.44–1.00)
GFR, Estimated: >60 mL/min (ref >60)
Glucose, Bld: 113 mg/dL — ABNORMAL HIGH (ref 70–99)
Potassium: 4.3 mmol/L (ref 3.5–5.1)
Sodium: 131 mmol/L — ABNORMAL LOW (ref 135–145)
Total Bilirubin: 0.7 mg/dL (ref 0.0–1.2)
Total Protein: 6.5 g/dL (ref 6.5–8.1)

## 2024-08-10 LAB — CBC WITH DIFFERENTIAL/PLATELET
Abs Immature Granulocytes: 0.04 K/uL (ref 0.00–0.07)
Basophils Absolute: 0 K/uL (ref 0.0–0.1)
Basophils Relative: 0 %
Eosinophils Absolute: 0 K/uL (ref 0.0–0.5)
Eosinophils Relative: 0 %
HCT: 39.1 % (ref 36.0–46.0)
Hemoglobin: 13.4 g/dL (ref 12.0–15.0)
Immature Granulocytes: 0 %
Lymphocytes Relative: 9 %
Lymphs Abs: 1 K/uL (ref 0.7–4.0)
MCH: 31.6 pg (ref 26.0–34.0)
MCHC: 34.3 g/dL (ref 30.0–36.0)
MCV: 92.2 fL (ref 80.0–100.0)
Monocytes Absolute: 1.1 K/uL — ABNORMAL HIGH (ref 0.1–1.0)
Monocytes Relative: 11 %
Neutro Abs: 8.4 K/uL — ABNORMAL HIGH (ref 1.7–7.7)
Neutrophils Relative %: 80 %
Platelets: 142 K/uL — ABNORMAL LOW (ref 150–400)
RBC: 4.24 MIL/uL (ref 3.87–5.11)
RDW: 13.6 % (ref 11.5–15.5)
WBC: 10.6 K/uL — ABNORMAL HIGH (ref 4.0–10.5)
nRBC: 0 % (ref 0.0–0.2)

## 2024-08-10 MED ORDER — AMIODARONE HCL 200 MG PO TABS
200.0000 mg | ORAL_TABLET | Freq: Every day | ORAL | Status: DC
  Administered 2024-08-11 – 2024-08-15 (×5): 200 mg via ORAL
  Filled 2024-08-10 (×5): qty 1

## 2024-08-10 MED ORDER — ALBUTEROL SULFATE (2.5 MG/3ML) 0.083% IN NEBU: 3.0000 mL | INHALATION_SOLUTION | Freq: Four times a day (QID) | RESPIRATORY_TRACT | Status: DC | PRN

## 2024-08-10 MED ORDER — LACTATED RINGERS IV SOLN: INTRAVENOUS | Status: AC

## 2024-08-10 MED ORDER — ONDANSETRON HCL 4 MG/2ML IJ SOLN
4.0000 mg | Freq: Four times a day (QID) | INTRAMUSCULAR | Status: AC | PRN
Start: 2024-08-10 — End: ?
  Administered 2024-08-10 – 2024-08-12 (×3): 4 mg via INTRAVENOUS
  Filled 2024-08-10 (×3): qty 2

## 2024-08-10 MED ORDER — HYDROMORPHONE HCL 1 MG/ML IJ SOLN
0.5000 mg | Freq: Once | INTRAMUSCULAR | Status: AC
  Administered 2024-08-10: 0.5 mg via INTRAVENOUS
  Filled 2024-08-10: qty 0.5

## 2024-08-10 MED ORDER — BISACODYL 10 MG RE SUPP: 10.0000 mg | Freq: Every day | RECTAL | Status: DC | PRN

## 2024-08-10 MED ORDER — METOPROLOL SUCCINATE ER 25 MG PO TB24
25.0000 mg | ORAL_TABLET | Freq: Every day | ORAL | Status: DC
  Filled 2024-08-10: qty 1

## 2024-08-10 MED ORDER — HYDROMORPHONE HCL 1 MG/ML IJ SOLN
1.0000 mg | INTRAMUSCULAR | Status: DC | PRN
  Administered 2024-08-10 – 2024-08-12 (×11): 1 mg via INTRAVENOUS
  Filled 2024-08-10 (×12): qty 1

## 2024-08-10 MED ORDER — DOCUSATE SODIUM 100 MG PO CAPS
100.0000 mg | ORAL_CAPSULE | Freq: Two times a day (BID) | ORAL | Status: DC
  Administered 2024-08-10 – 2024-08-12 (×4): 100 mg via ORAL
  Filled 2024-08-10 (×4): qty 1

## 2024-08-10 MED ORDER — ONDANSETRON HCL 4 MG PO TABS: 4.0000 mg | ORAL_TABLET | Freq: Four times a day (QID) | ORAL | Status: DC | PRN

## 2024-08-10 NOTE — ED Provider Notes (Signed)
  EMERGENCY DEPARTMENT AT [facility] Provider Note  CSN: [id number] Arrival date & time: 08/10/24 1452  Chief Complaint(s) Fall  HPI Nancy Avila is a 83 y.o. female presenting with fall.  Patient reports she was moving into a chair when she fell on the left hip.  She does report she hit her head as well.  She takes Eliquis .  No loss of consciousness.  Denies numbness or tingling.  Reports primarily pain in the left hip.  Denies other pain.  Denies chest pain or shortness of breath.  Denies headache.   Past Medical History Past Medical History:  Diagnosis Date   Anxiety    Atrial fibrillation (HCC)    COPD (chronic obstructive pulmonary disease) (HCC)    Cough    Cystoid macular edema    Dizziness    Hyperglyceridemia    Lightheadedness    Nasal congestion    URI (upper respiratory infection)    Patient Active Problem List   Diagnosis Date Noted   Closed left hip fracture (HCC) 08/10/2024   Encounter for monitoring amiodarone  therapy 12/15/2023   Persistent atrial fibrillation (HCC) 10/25/2023   Hypercoagulable state due to persistent atrial fibrillation (HCC) 10/25/2023   Viral conjunctivitis of right eye 01/28/2022   Eye pain, left 12/23/2020   Cystoid macular edema of left eye 06/11/2020   Home Medication(s) Prior to Admission medications   Medication Sig Start Date End Date Taking? Authorizing Provider  albuterol (VENTOLIN HFA) 108 (90 Base) MCG/ACT inhaler Inhale 1-2 puffs into the lungs every 6 (six) hours as needed for wheezing or shortness of breath.    [provider]  amiodarone  (PACERONE ) 200 MG tablet TAKE 1 TAB BY MOUTH 2 (TWO) TIMES DAILY FOR 30 DAYS, THEN 1 TABLET (200 MG TOTAL) DAILY. Patient taking differently: Take 200 mg by mouth daily. 11/24/23   Terra Fairy PARAS, PA-C  apixaban  (ELIQUIS ) 5 MG TABS tablet Take 1 tablet (5 mg total) by mouth 2 (two) times daily. 03/21/24   Verlin Lonni BIRCH, MD  Ascorbic Acid (VITAMIN  C) 1000 MG tablet Take 1,000 mg by mouth 3 (three) times daily.    [provider]  Cholecalciferol (VITAMIN D3) 75 MCG (3000 UT) TABS Take 3,000 Units by mouth daily.    [provider]  clonazePAM (KLONOPIN) 0.5 MG tablet Take 0.25 mg by mouth as needed for anxiety. 08/25/21   [provider]  fluticasone (FLONASE) 50 MCG/ACT nasal spray Place 2 sprays into both nostrils daily as needed for allergies. 09/06/23 09/05/24  [provider]  HAWTHORN PO Take 425 mg by mouth 3 (three) times daily.    [provider]  Magnesium 300 MG CAPS Take 300 mg by mouth 3 (three) times daily.    [provider]  metoprolol  succinate (TOPROL  XL) 25 MG 24 hr tablet Take 1 tablet (25 mg total) by mouth daily. 09/07/23   Verlin Lonni BIRCH, MD  Misc Natural Products [facility] COMPLEX PO) Take 1 capsule by mouth daily.    [provider]  Multiple Vitamin (MULTIVITAMIN WITH MINERALS) TABS tablet Take 1 tablet by mouth daily.    [provider]  Multiple Vitamins-Minerals (HAIR SKIN NAILS PO) Take 1 capsule by mouth daily.    [provider]  Omega-3 Fatty Acids (OMEGA 3 PO) Take 2 capsules by mouth daily.    [provider]  OVER THE COUNTER MEDICATION Take 2 tablets by mouth daily. Memory and Brain supplement    [provider]  OVER THE COUNTER MEDICATION 1 Dose See admin instructions. Sovereign Silver immune support SL liquid  Take 1 dropper full under the tongue every other day, may inhale in 3 to 4 droppers full as a nebulizing solution as needed for shortness of breath    [provider]  Probiotic Product (PROBIOTIC PO) Take 1 capsule by mouth daily.    [provider]  TURMERIC PO Take 400 mg by mouth daily.    [provider]  vitamin B-12 (CYANOCOBALAMIN) 500 MCG tablet Take 500 mcg by mouth daily.    [provider]  VITAMIN D-VITAMIN K PO Take 1 capsule by mouth daily.     [provider]  Zinc 30 MG CAPS Take 30 mg by mouth daily.    [provider]                                                                                                                                    Past Surgical History Past Surgical History:  Procedure Laterality Date   ABDOMINAL HYSTERECTOMY     CARDIOVERSION N/A 11/01/2023   Procedure: CARDIOVERSION;  Surgeon: Loni Soyla LABOR, MD;  Location: [facility] INVASIVE CV LAB;  Service: Cardiovascular;  Laterality: N/A;   CHOLECYSTECTOMY     EYE SURGERY     Family History Family History  Problem Relation Age of Onset   Cancer Mother        THROAT   Heart attack Father 53   Cancer Brother        COLON   Aneurysm Brother    Alcohol abuse Brother    Stomach cancer Brother    Breast cancer Neg Hx     Social History Social History   Tobacco Use   Smoking status: Former    Current packs/day: 1.00    Types: Cigarettes   Smokeless tobacco: Never   Tobacco comments:    Former smoker 10/25/23  Substance Use Topics   Alcohol use: No   Drug use: No   Allergies Codeine and Nyquil cough dm + congestion [phenylephrine-doxylamine-dm]  Review of Systems Review of Systems  All other systems reviewed and are negative.   Physical Exam Vital Signs  I have reviewed the triage vital signs BP (!) 170/82   Pulse 63   Temp 98.5 F (36.9 C) (Oral)   Resp 20   Wt 83.9 kg   SpO2 (S) 94%   BMI 27.72 kg/m  Physical Exam Vitals and nursing note reviewed.  Constitutional:      General: She is not in acute distress.    Appearance: She is well-developed.  HENT:     Head: Normocephalic and atraumatic.     Mouth/Throat:     Mouth: Mucous membranes are moist.  Eyes:     Pupils: Pupils are equal, round, and reactive to light.  Cardiovascular:     Rate and Rhythm: Normal rate and  regular rhythm.     Heart sounds: No murmur heard. Pulmonary:     Effort: Pulmonary effort is normal. No respiratory distress.      Breath sounds: Normal breath sounds.  Abdominal:     General: Abdomen is flat.     Palpations: Abdomen is soft.     Tenderness: There is no abdominal tenderness.  Musculoskeletal:        General: No tenderness.     Right lower leg: No edema.     Left lower leg: No edema.     Comments: No midline c/t/l spine ttp. No chest wall tenderness or crepitus. BUE atraumatic throughout without tenderness. RLE with intact ROM without tenderness. TTP left hip with severe pain with ROM. Left knee/ankle/foot atraumatic.   Skin:    General: Skin is warm and dry.  Neurological:     General: No focal deficit present.     Mental Status: She is alert. Mental status is at baseline.  Psychiatric:        Mood and Affect: Mood normal.        Behavior: Behavior normal.     ED Results and Treatments Labs (all labs ordered are listed, but only abnormal results are displayed) Labs Reviewed  COMPREHENSIVE METABOLIC PANEL WITH GFR - Abnormal; Notable for the following components:      Result Value   Sodium 131 (*)    Chloride 96 (*)    Glucose, Bld 113 (*)    AST 44 (*)    ALT 49 (*)    All other components within normal limits  CBC WITH DIFFERENTIAL/PLATELET - Abnormal; Notable for the following components:   WBC 10.6 (*)    Platelets 142 (*)    Neutro Abs 8.4 (*)    Monocytes Absolute 1.1 (*)    All other components within normal limits                                                                                                                          Radiology CT Cervical Spine Wo Contrast Result Date: 08/10/2024 EXAM: CT CERVICAL SPINE WITHOUT CONTRAST 08/10/2024 04:51:00 PM TECHNIQUE: CT of the cervical spine was performed without the administration of intravenous contrast. Multiplanar reformatted images are provided for review. Automated exposure control, iterative reconstruction, and/or weight based adjustment of the mA/kV was utilized to reduce the radiation dose to as low as reasonably  achievable. COMPARISON: None available. CLINICAL HISTORY: Neck trauma (Age >= 65y) FINDINGS: CERVICAL SPINE: BONES AND ALIGNMENT: Straightening of the normal cervical lordosis. Degenerative changes of the dens. No high grade osseous spinal canal stenosis. No acute fracture or traumatic malalignment. DEGENERATIVE CHANGES: Mild disc space narrowing at C6-C7. Facet arthrosis and uncovertebral hypertrophy most pronounced at C5-C6 and C6-C7 with associated foraminal stenosis at these levels most pronounced on the left at C5-C6. SOFT TISSUES: Atherosclerosis at the carotid bifurcations. Heterogeneous appearance of the thyroid  with multiple subcentimeter nodules. Nodular soft tissue at the right lung apex  along the pleura seen on series 8 image 73 and on series 4 image 20 without definite correlate on CTA from 2020. Recommend nonemergent CT of the chest for further evaluation. No prevertebral soft tissue swelling. IMPRESSION: 1. No acute abnormality of the cervical spine related to the reported neck trauma. 2. Degenerative changes as above. 3. Right apical pleural-based nodular soft tissue without definite 2020 CTA correlate. Recommend non-emergent chest CT for further evaluation. Electronically signed by: Donnice Mania MD 08/10/2024 05:07 PM EST RP Workstation: [id number]   CT Head Wo Contrast Result Date: 08/10/2024 EXAM: CT HEAD WITHOUT CONTRAST 08/10/2024 04:51:00 PM TECHNIQUE: CT of the head was performed without the administration of intravenous contrast. Automated exposure control, iterative reconstruction, and/or weight based adjustment of the mA/kV was utilized to reduce the radiation dose to as low as reasonably achievable. COMPARISON: 03/14/2008. CLINICAL HISTORY: Head trauma, minor (Age >= 65y). FINDINGS: BRAIN AND VENTRICLES: No acute hemorrhage. No evidence of acute infarct. No hydrocephalus. No extra-axial collection. No mass effect or midline shift. Intracranial atherosclerosis involving the carotid  siphons and intracranial right vertebral artery. ORBITS: Bilateral lens replacement. SINUSES: No acute abnormality. SOFT TISSUES AND SKULL: No acute soft tissue abnormality. No skull fracture. IMPRESSION: 1. No acute intracranial abnormality. Electronically signed by: Donnice Mania MD 08/10/2024 04:58 PM EST RP Workstation: [id number]   DG Chest Port 1 View Result Date: 08/10/2024 CLINICAL DATA:  Status post fall. EXAM: PORTABLE CHEST 1 VIEW COMPARISON:  August 02, 2013 FINDINGS: The heart size and mediastinal contours are within normal limits. Mild, diffuse, chronic appearing increased interstitial lung markings are seen. No acute infiltrate, pleural effusion or pneumothorax is identified. Multilevel degenerative changes are present throughout the thoracic spine. IMPRESSION: Chronic appearing increased interstitial lung markings without acute or active cardiopulmonary disease. Electronically Signed   By: Suzen Dials M.D.   On: 08/10/2024 16:07   DG Hip Unilat W or Wo Pelvis 2-3 Views Left Result Date: 08/10/2024 CLINICAL DATA:  Status post fall. EXAM: DG HIP (WITH OR WITHOUT PELVIS) 2-3V LEFT COMPARISON:  None Available. FINDINGS: An acute fracture deformity is seen extending through the neck of the proximal left femur with approximately 1/2 shaft width dorsal displacement of the distal fracture site. There is no evidence of dislocation. Mild degenerative changes are seen in the form of joint space narrowing and acetabular sclerosis. IMPRESSION: Acute fracture of the proximal left femur. Electronically Signed   By: Suzen Dials M.D.   On: 08/10/2024 16:05    Pertinent labs & imaging results that were available during my care of the patient were reviewed by me and considered in my medical decision making (see MDM for details).  Medications Ordered in ED Medications  HYDROmorphone (DILAUDID) injection 1 mg (has no administration in time range)  HYDROmorphone (DILAUDID) injection 0.5 mg (0.5  mg Intravenous Given 08/10/24 1558)  HYDROmorphone (DILAUDID) injection 0.5 mg (0.5 mg Intravenous Given 08/10/24 1731)  Procedures Procedures  (including critical care time)  Medical Decision Making / ED Course   MDM:  83 year old presenting to the emergency department with fall.  Patient is overall well-appearing, physical examination with pain around the left hip.  No other signs of trauma.  X-ray does show a hip fracture.  Was witnessed by daughter who reports his mechanical fall.  Patient did strike her head and is on Eliquis  so we will obtain CT head also.  Given age will obtain CT cervical spine.  Will require admission.  Patient noted to be hypoxic, possibly related to fentanyl administration, denies any shortness of breath or chest pain, cough but will also obtain chest x-ray.  Clinical Course as of 08/10/24 1933  [place] Aug 10, 2024  8069 Signed out to Dr. Barbra for admission. Discussed hip fracture with Dr. Margrette who will consult [WS]    Clinical Course User Index [WS] Francesca Elsie CROME, MD     Additional history obtained: -Additional history obtained from ems -External records from outside source obtained and reviewed including: Chart review including previous notes, labs, imaging, consultation notes including prior notes    Lab Tests: -I ordered, reviewed, and interpreted labs.   The pertinent results include:   Labs Reviewed  COMPREHENSIVE METABOLIC PANEL WITH GFR - Abnormal; Notable for the following components:      Result Value   Sodium 131 (*)    Chloride 96 (*)    Glucose, Bld 113 (*)    AST 44 (*)    ALT 49 (*)    All other components within normal limits  CBC WITH DIFFERENTIAL/PLATELET - Abnormal; Notable for the following components:   WBC 10.6 (*)    Platelets 142 (*)    Neutro Abs 8.4 (*)    Monocytes Absolute  1.1 (*)    All other components within normal limits    Notable for mild leukocytosis    Imaging Studies ordered: I ordered imaging studies including XR hip , ct head  On my interpretation imaging demonstrates hip fx  I independently visualized and interpreted imaging. I agree with the radiologist interpretation   Medicines ordered and prescription drug management: Meds ordered this encounter  Medications   HYDROmorphone (DILAUDID) injection 0.5 mg   HYDROmorphone (DILAUDID) injection 0.5 mg   HYDROmorphone (DILAUDID) injection 1 mg    -I have reviewed the patients home medicines and have made adjustments as needed   Consultations Obtained: I requested consultation with the orthopedist,  and discussed lab and imaging findings as well as pertinent plan - they recommend: admission   Cardiac Monitoring: The patient was maintained on a cardiac monitor.  I personally viewed and interpreted the cardiac monitored which showed an underlying rhythm of: NSR  Reevaluation: After the interventions noted above, I reevaluated the patient and found that their symptoms have improved  Co morbidities that complicate the patient evaluation  Past Medical History:  Diagnosis Date   Anxiety    Atrial fibrillation (HCC)    COPD (chronic obstructive pulmonary disease) (HCC)    Cough    Cystoid macular edema    Dizziness    Hyperglyceridemia    Lightheadedness    Nasal congestion    URI (upper respiratory infection)       Dispostion: Disposition decision including need for hospitalization was considered, and patient admitted to the hospital.    Final Clinical Impression(s) / ED Diagnoses Final diagnoses:  Closed fracture of left hip, initial encounter [facility])     This chart  was dictated using voice recognition software.  Despite best efforts to proofread,  errors can occur which can change the documentation meaning.    Francesca Elsie CROME, MD 08/10/24 [phone]

## 2024-08-10 NOTE — H&P (Signed)
 History and Physical    Patient: Nancy Avila FMW:[id number] DOB: 1941-04-01 DOA: 08/10/2024 DOS: the patient was seen and examined on 08/10/2024 PCP: Debrah Josette ORN., PA-C  Patient coming from: Home  Chief Complaint:  Chief Complaint  Patient presents with   Fall   HPI: Nancy Avila is a 83 y.o. female with medical history significant of COPD, atrial fibrillation on anticoagulation, hyperglycemia.  Patient brought in via EMS due to fall at home after trying to move the chair.  This was a mechanical fall.  She did hit her head and her left hip.  She had immediate pain and was unable to move.  Here, xray shows left hip fracture.  She is on Eliquis  for atrial fibrillation and took her last dose this morning.  No fevers, chills, nausea, vomiting.  She does have limited ambulation distance due to her COPD.  She does not develop chest pain.  For her atrial fibrillation, she is on amiodarone  which keeps her in sinus rhythm.  Review of Systems: As mentioned in the history of present illness. All other systems reviewed and are negative. Past Medical History:  Diagnosis Date   Anxiety    Atrial fibrillation (HCC)    COPD (chronic obstructive pulmonary disease) (HCC)    Cough    Cystoid macular edema    Dizziness    Hyperglyceridemia    Lightheadedness    Nasal congestion    URI (upper respiratory infection)    Past Surgical History:  Procedure Laterality Date   ABDOMINAL HYSTERECTOMY     CARDIOVERSION N/A 11/01/2023   Procedure: CARDIOVERSION;  Surgeon: Loni Soyla LABOR, MD;  Location: [facility] INVASIVE CV LAB;  Service: Cardiovascular;  Laterality: N/A;   CHOLECYSTECTOMY     EYE SURGERY     Social History:  reports that she has quit smoking. Her smoking use included cigarettes. She has never used smokeless tobacco. She reports that she does not drink alcohol and does not use drugs.  Allergies  Allergen Reactions   Codeine     MADE HER HAVE NIGHMARES   Nyquil Cough Dm +  Congestion [Phenylephrine-Doxylamine-Dm]     MADE HER FEEL CRAZY    Family History  Problem Relation Age of Onset   Cancer Mother        THROAT   Heart attack Father 48   Cancer Brother        COLON   Aneurysm Brother    Alcohol abuse Brother    Stomach cancer Brother    Breast cancer Neg Hx     Prior to Admission medications   Medication Sig Start Date End Date Taking? Authorizing Provider  albuterol (VENTOLIN HFA) 108 (90 Base) MCG/ACT inhaler Inhale 1-2 puffs into the lungs every 6 (six) hours as needed for wheezing or shortness of breath.    [provider]  amiodarone  (PACERONE ) 200 MG tablet TAKE 1 TAB BY MOUTH 2 (TWO) TIMES DAILY FOR 30 DAYS, THEN 1 TABLET (200 MG TOTAL) DAILY. Patient taking differently: Take 200 mg by mouth daily. 11/24/23   Terra Fairy PARAS, PA-C  apixaban  (ELIQUIS ) 5 MG TABS tablet Take 1 tablet (5 mg total) by mouth 2 (two) times daily. 03/21/24   Verlin Lonni BIRCH, MD  Ascorbic Acid (VITAMIN C) 1000 MG tablet Take 1,000 mg by mouth 3 (three) times daily.    [provider]  Cholecalciferol (VITAMIN D3) 75 MCG (3000 UT) TABS Take 3,000 Units by mouth daily.    [provider]  clonazePAM (KLONOPIN) 0.5 MG tablet Take 0.25 mg by mouth as needed for anxiety. 08/25/21   [provider]  fluticasone (FLONASE) 50 MCG/ACT nasal spray Place 2 sprays into both nostrils daily as needed for allergies. 09/06/23 09/05/24  [provider]  HAWTHORN PO Take 425 mg by mouth 3 (three) times daily.    [provider]  Magnesium 300 MG CAPS Take 300 mg by mouth 3 (three) times daily.    [provider]  metoprolol  succinate (TOPROL  XL) 25 MG 24 hr tablet Take 1 tablet (25 mg total) by mouth daily. 09/07/23   Verlin Lonni BIRCH, MD  Misc Natural Products [facility] COMPLEX PO) Take 1 capsule by mouth daily.    [provider]  Multiple Vitamin (MULTIVITAMIN WITH MINERALS) TABS tablet Take 1 tablet  by mouth daily.    [provider]  Multiple Vitamins-Minerals (HAIR SKIN NAILS PO) Take 1 capsule by mouth daily.    [provider]  Omega-3 Fatty Acids (OMEGA 3 PO) Take 2 capsules by mouth daily.    [provider]  OVER THE COUNTER MEDICATION Take 2 tablets by mouth daily. Memory and Brain supplement    [provider]  OVER THE COUNTER MEDICATION 1 Dose See admin instructions. Sovereign Silver immune support SL liquid  Take 1 dropper full under the tongue every other day, may inhale in 3 to 4 droppers full as a nebulizing solution as needed for shortness of breath    [provider]  Probiotic Product (PROBIOTIC PO) Take 1 capsule by mouth daily.    [provider]  TURMERIC PO Take 400 mg by mouth daily.    [provider]  vitamin B-12 (CYANOCOBALAMIN) 500 MCG tablet Take 500 mcg by mouth daily.    [provider]  VITAMIN D-VITAMIN K PO Take 1 capsule by mouth daily.    [provider]  Zinc 30 MG CAPS Take 30 mg by mouth daily.    [provider]    Physical Exam: Vitals:   08/10/24 1500 08/10/24 1504 08/10/24 1505 08/10/24 1942  BP: (!) 170/82   (!) 153/77  Pulse: 63   62  Resp:  20  18  Temp:   98.5 F (36.9 C) 98 F (36.7 C)  TempSrc:   Oral   SpO2: (S) (!) 88%  (S) 94% 97%  Weight: 83.9 kg      General: Elderly female. Awake and alert and oriented x3. No acute cardiopulmonary distress.  HEENT: Normocephalic atraumatic.  Right and left ears normal in appearance.  Pupils equal, round, reactive to light. Extraocular muscles are intact. Sclerae anicteric and noninjected.  Moist mucosal membranes. No mucosal lesions.  Neck: Neck supple without lymphadenopathy. No carotid bruits. No masses palpated.  Cardiovascular: Regular rate with normal S1-S2 sounds. No murmurs, rubs, gallops auscultated. No JVD.  Respiratory: Good respiratory effort with no wheezes, rales, rhonchi. Lungs clear to  auscultation bilaterally.  No accessory muscle use. Abdomen: Soft, nontender, nondistended. Active bowel sounds. No masses or hepatosplenomegaly  Skin: No rashes, lesions, or ulcerations.  Dry, warm to touch. 2+ dorsalis pedis and radial pulses. Musculoskeletal: Pain in left hip.  Left hip shortened and externally rotated. Psychiatric: Intact judgment and insight. Pleasant and cooperative. Neurologic: No focal neurological deficits. Strength is 5/5 and symmetric in upper and lower extremities.  Cranial nerves II through XII are grossly intact.  Data Reviewed: {All labs and imaging obtained by me  Assessment and Plan: No notes have  been filed under this hospital service. Service: Hospitalist  Principal Problem:   Closed left hip fracture (HCC) Active Problems:   Persistent atrial fibrillation (HCC)   Hypercoagulable state due to persistent atrial fibrillation (HCC)   COPD (chronic obstructive pulmonary disease) (HCC)  Close hip fracture left side Admit NPO after midnight Hold eliquis  Last echo 10/17/2023 - dilated atrium, moderate mitral valve regurgitation. Will repeat echo in AM. COPD Consult respiratory therapy due to high risk patient Persistent atrial fibrillation Hold eliquis  Continue amiodarone  and metoprolol  Currently in sinus Hypercoagulable Hold eliquis . Last dose 11/14 AM Restart as soon as possible after surgery Mitral valve regurgiation Moderate on last echo     Advance Care Planning:   Code Status: Not on file Full code confirmed by patient  Consults: ortho  Family Communication: daughter present  Severity of Illness: The appropriate patient status for this patient is INPATIENT. Inpatient status is judged to be reasonable and necessary in order to provide the required intensity of service to ensure the patient's safety. The patient's presenting symptoms, physical exam findings, and initial radiographic and laboratory data in the context of their chronic  comorbidities is felt to place them at high risk for further clinical deterioration. Furthermore, it is not anticipated that the patient will be medically stable for discharge from the hospital within 2 midnights of admission.   * I certify that at the point of admission it is my clinical judgment that the patient will require inpatient hospital care spanning beyond 2 midnights from the point of admission due to high intensity of service, high risk for further deterioration and high frequency of surveillance required.*  Author: Fabiano Ginley J Monty Spicher, DO 08/10/2024 8:05 PM  For on call review [email or web address].

## 2024-08-10 NOTE — ED Triage Notes (Signed)
 PT bib ems fell moving a chair and landed on left hip. Denies loc   EMS vitals  20g lac 50mcg fentanyl 198/91 64 hr 92%ra

## 2024-08-10 NOTE — ED Notes (Signed)
 Pt states did hit head when she fell. Denies any head pain. Pt is on blood thinner

## 2024-08-11 ENCOUNTER — Inpatient Hospital Stay (HOSPITAL_COMMUNITY)

## 2024-08-11 DIAGNOSIS — S72002K Fracture of unspecified part of neck of left femur, subsequent encounter for closed fracture with nonunion: Secondary | ICD-10-CM

## 2024-08-11 DIAGNOSIS — J438 Other emphysema: Secondary | ICD-10-CM

## 2024-08-11 DIAGNOSIS — I4819 Other persistent atrial fibrillation: Secondary | ICD-10-CM

## 2024-08-11 DIAGNOSIS — Z01810 Encounter for preprocedural cardiovascular examination: Secondary | ICD-10-CM | POA: Diagnosis not present

## 2024-08-11 DIAGNOSIS — S72002A Fracture of unspecified part of neck of left femur, initial encounter for closed fracture: Secondary | ICD-10-CM | POA: Diagnosis not present

## 2024-08-11 LAB — SURGICAL PCR SCREEN
MRSA, PCR: NOT DETECTED — AB
Staphylococcus aureus: DETECTED — AB

## 2024-08-11 LAB — BASIC METABOLIC PANEL WITH GFR
Anion gap: 9 (ref 5–15)
BUN: 13 mg/dL (ref 8–23)
CO2: 27 mmol/L (ref 22–32)
Calcium: 8.7 mg/dL — ABNORMAL LOW (ref 8.9–10.3)
Chloride: 95 mmol/L — ABNORMAL LOW (ref 98–111)
Creatinine, Ser: 0.71 mg/dL (ref 0.44–1.00)
GFR, Estimated: >60 mL/min (ref >60)
Glucose, Bld: 120 mg/dL — ABNORMAL HIGH (ref 70–99)
Potassium: 4.5 mmol/L (ref 3.5–5.1)
Sodium: 130 mmol/L — ABNORMAL LOW (ref 135–145)

## 2024-08-11 LAB — CBC
HCT: 37.5 % (ref 36.0–46.0)
Hemoglobin: 12.9 g/dL (ref 12.0–15.0)
MCH: 31.5 pg (ref 26.0–34.0)
MCHC: 34.4 g/dL (ref 30.0–36.0)
MCV: 91.5 fL (ref 80.0–100.0)
Platelets: 125 K/uL — ABNORMAL LOW (ref 150–400)
RBC: 4.1 MIL/uL (ref 3.87–5.11)
RDW: 13.5 % (ref 11.5–15.5)
WBC: 8.8 K/uL (ref 4.0–10.5)
nRBC: 0 % (ref 0.0–0.2)

## 2024-08-11 LAB — ECHOCARDIOGRAM COMPLETE
Area-P 1/2: 2.34 cm2
Calc EF: 68.5 %
Height: 68 in
S' Lateral: 2.4 cm
Single Plane A2C EF: 72.1 %
Single Plane A4C EF: 65.8 %
Weight: 2987.67 [oz_av]

## 2024-08-11 LAB — TYPE AND SCREEN
ABO/RH(D): A NEG
Antibody Screen: NEGATIVE

## 2024-08-11 MED ORDER — METOPROLOL SUCCINATE ER 25 MG PO TB24
12.5000 mg | ORAL_TABLET | Freq: Every day | ORAL | Status: DC
  Administered 2024-08-11 – 2024-08-15 (×4): 12.5 mg via ORAL
  Filled 2024-08-11 (×4): qty 1

## 2024-08-11 MED ORDER — CHLORHEXIDINE GLUCONATE 4 % EX SOLN
60.0000 mL | Freq: Once | CUTANEOUS | Status: AC
  Filled 2024-08-11: qty 15

## 2024-08-11 MED ORDER — ACETAMINOPHEN 10 MG/ML IV SOLN
1000.0000 mg | Freq: Four times a day (QID) | INTRAVENOUS | Status: AC
Start: 2024-08-11 — End: 2024-08-11
  Administered 2024-08-11 (×4): 1000 mg via INTRAVENOUS
  Filled 2024-08-11 (×5): qty 100

## 2024-08-11 MED ORDER — MUPIROCIN 2 % EX OINT
1.0000 | TOPICAL_OINTMENT | Freq: Two times a day (BID) | CUTANEOUS | Status: DC
  Administered 2024-08-12 – 2024-08-13 (×4): 1 via NASAL
  Filled 2024-08-11 (×3): qty 22

## 2024-08-11 NOTE — Plan of Care (Signed)

## 2024-08-11 NOTE — Hospital Course (Signed)
 83 year old female with a history of persistent atrial fibrillation, COPD, hyperlipidemia, anxiety presenting with a mechanical fall.  The patient was moving a chair/furniture around her house when she sustained a fall onto her left side.  She had left hip pain and difficulty getting up. At baseline, the patient is fully independent with her ADLs.  She still drives.  She lives by herself. The patient had been in her usual state of health without any complaints. Patient denies fevers, chills, headache, chest pain, dyspnea, nausea, vomiting, diarrhea, abdominal pain, dysuria, hematuria, hematochezia, and melena.  In the ED, the patient was afebrile hemodynamically stable with oxygen saturation 94% 2 L.  Apparently she had some oxygen desaturation after she was given intravenous opioids. Initial labs showed WBC 10.8, hemoglobin 13.4, plates 857.  Sodium 131, potassium 4.3, bicarbonate 26, serum creatinine 0.80.  AST 44, ALT 49, alk phosphatase 60, total bilirubin 0.7.  Orthopedic surgery, Dr. Margrette was consulted.  Patient underwent right hip arthroplasty on 08/12/2024.  She had no immediate postoperative complications.  PT was consulted.  They recommended skilled nursing facility with which the patient and family agreed.  The patient did have some hyponatremia which is felt to be secondary to poor solute intake and volume depletion.  She was started on IV NS with improvement. Patient was noted to have new 2 L oxygen.  She has a prior history of tobacco use.  Chest x-ray showed chronic interstitial markings.  Suspect patient has underlying COPD and has required oxygen.  She will go to a skilled nursing facility with 2 L nasal cannula.  Wean oxygen as tolerated for saturation of 92%.

## 2024-08-11 NOTE — Plan of Care (Signed)
   Problem: Education: Goal: Knowledge of General Education information will improve Description Including pain rating scale, medication(s)/side effects and non-pharmacologic comfort measures Outcome: Progressing

## 2024-08-11 NOTE — Progress Notes (Signed)
 Patient ID: Nancy Avila, female   DOB: Jun 12, 1941, 83 y.o.   MRN: [id number]   LEFT HIP FRACTURE  LAST DOSE ELIQUIS  8AM 11/14  SURGERY SCHEDULE FOR 11/16 (36-48 HRS AFTER LAST DOSE

## 2024-08-11 NOTE — Progress Notes (Signed)
  Echocardiogram 2D Echocardiogram has been performed.  Nancy Avila 08/11/2024, 10:46 AM

## 2024-08-11 NOTE — Consult Note (Signed)
 Reason for Consult:left hip fracture  Referring Physician: Dr Alm Tat  Nancy Avila is an 83 y.o. female.  HPI: This is an 83 year old female who was putting up a Christmas tree and moved her chair but her foot got caught she landed on her left side and fell onto her left hip she complained of left hip pain immediately and has and was unable to bear weight.  She has a history of COPD atrial fibrillation she is on Eliquis  metoprolol  and amiodarone .  She had a cardioversion in February 2025  She complains of severe left hip pain with deformity.  The pain increases when she tries to move her left leg.  She has had some decrease in blood pressure when given IV pain medication  Past Medical History:  Diagnosis Date   Anxiety    Atrial fibrillation (HCC)    COPD (chronic obstructive pulmonary disease) (HCC)    Cough    Cystoid macular edema    Dizziness    Hyperglyceridemia    Lightheadedness    Nasal congestion    URI (upper respiratory infection)     Past Surgical History:  Procedure Laterality Date   ABDOMINAL HYSTERECTOMY     CARDIOVERSION N/A 11/01/2023   Procedure: CARDIOVERSION;  Surgeon: Loni Soyla LABOR, MD;  Location: [facility];  Service: Cardiovascular;  Laterality: N/A;   CHOLECYSTECTOMY     EYE SURGERY      Family History  Problem Relation Age of Onset   Cancer Mother        THROAT   Heart attack Father 22   Cancer Brother        COLON   Aneurysm Brother    Alcohol abuse Brother    Stomach cancer Brother    Breast cancer Neg Hx     Social History:  reports that she has quit smoking. Her smoking use included cigarettes. She has never used smokeless tobacco. She reports that she does not drink alcohol and does not use drugs.  Allergies:  Allergies  Allergen Reactions   Codeine     MADE HER HAVE NIGHMARES   Nyquil Cough Dm + Congestion [Phenylephrine-Doxylamine-Dm]     MADE HER FEEL CRAZY    Medications: I have reviewed the patient's current  medications.  Results for orders placed or performed during the hospital encounter of 08/10/24 (from the past 48 hours)  Comprehensive metabolic panel     Status: Abnormal   Collection Time: 08/10/24  3:45 PM  Result Value Ref Range   Sodium 131 (L) 135 - 145 mmol/L   Potassium 4.3 3.5 - 5.1 mmol/L   Chloride 96 (L) 98 - 111 mmol/L   CO2 26 22 - 32 mmol/L   Glucose, Bld 113 (H) 70 - 99 mg/dL    Comment: Glucose reference range applies only to samples taken after fasting for at least 8 hours.   BUN 16 8 - 23 mg/dL   Creatinine, Ser 9.19 0.44 - 1.00 mg/dL   Calcium 9.2 8.9 - 89.6 mg/dL   Total Protein 6.5 6.5 - 8.1 g/dL   Albumin 4.4 3.5 - 5.0 g/dL   AST 44 (H) 15 - 41 U/L   ALT 49 (H) 0 - 44 U/L   Alkaline Phosphatase 60 38 - 126 U/L   Total Bilirubin 0.7 0.0 - 1.2 mg/dL   GFR, Estimated >39 >39 mL/min    Comment: (NOTE) Calculated using the CKD-EPI Creatinine Equation (2021)    Anion gap 10 5 -  15    Comment: Performed at [facility], [address] [place], [place] [address]  CBC with Differential     Status: Abnormal   Collection Time: 08/10/24  3:45 PM  Result Value Ref Range   WBC 10.6 (H) 4.0 - 10.5 K/uL   RBC 4.24 3.87 - 5.11 MIL/uL   Hemoglobin 13.4 12.0 - 15.0 g/dL   HCT 60.8 63.9 - 53.9 %   MCV 92.2 80.0 - 100.0 fL   MCH 31.6 26.0 - 34.0 pg   MCHC 34.3 30.0 - 36.0 g/dL   RDW 86.3 88.4 - 84.4 %   Platelets 142 (L) 150 - 400 K/uL   nRBC 0.0 0.0 - 0.2 %   Neutrophils Relative % 80 %   Neutro Abs 8.4 (H) 1.7 - 7.7 K/uL   Lymphocytes Relative 9 %   Lymphs Abs 1.0 0.7 - 4.0 K/uL   Monocytes Relative 11 %   Monocytes Absolute 1.1 (H) 0.1 - 1.0 K/uL   Eosinophils Relative 0 %   Eosinophils Absolute 0.0 0.0 - 0.5 K/uL   Basophils Relative 0 %   Basophils Absolute 0.0 0.0 - 0.1 K/uL   Immature Granulocytes 0 %   Abs Immature Granulocytes 0.04 0.00 - 0.07 K/uL    Comment: Performed at [facility], [address] [place], [place] [address]  Basic metabolic panel      Status: Abnormal   Collection Time: 08/11/24  4:27 AM  Result Value Ref Range   Sodium 130 (L) 135 - 145 mmol/L   Potassium 4.5 3.5 - 5.1 mmol/L   Chloride 95 (L) 98 - 111 mmol/L   CO2 27 22 - 32 mmol/L   Glucose, Bld 120 (H) 70 - 99 mg/dL    Comment: Glucose reference range applies only to samples taken after fasting for at least 8 hours.   BUN 13 8 - 23 mg/dL   Creatinine, Ser 9.28 0.44 - 1.00 mg/dL   Calcium 8.7 (L) 8.9 - 10.3 mg/dL   GFR, Estimated >39 >39 mL/min    Comment: (NOTE) Calculated using the CKD-EPI Creatinine Equation (2021)    Anion gap 9 5 - 15    Comment: Performed at [facility], [address] [place], [place] [address]  CBC     Status: Abnormal   Collection Time: 08/11/24  4:27 AM  Result Value Ref Range   WBC 8.8 4.0 - 10.5 K/uL   RBC 4.10 3.87 - 5.11 MIL/uL   Hemoglobin 12.9 12.0 - 15.0 g/dL   HCT 62.4 63.9 - 53.9 %   MCV 91.5 80.0 - 100.0 fL   MCH 31.5 26.0 - 34.0 pg   MCHC 34.4 30.0 - 36.0 g/dL   RDW 86.4 88.4 - 84.4 %   Platelets 125 (L) 150 - 400 K/uL   nRBC 0.0 0.0 - 0.2 %    Comment: Performed at [facility], [address] [place], [place] [address]   Review of imaging studies include CT scan, I interpret this as follows  no acute abnormalities degenerative spondylosis as expected in this age group   CT Cervical Spine Wo Contrast Result Date: 08/10/2024 EXAM: CT CERVICAL SPINE WITHOUT CONTRAST 08/10/2024 04:51:00 PM TECHNIQUE: CT of the cervical spine was performed without the administration of intravenous contrast. Multiplanar reformatted images are provided for review. Automated exposure control, iterative reconstruction, and/or weight based adjustment of the mA/kV was utilized to reduce the radiation dose to as low as reasonably achievable. COMPARISON: None available. CLINICAL HISTORY: Neck  trauma (Age >= 65y) FINDINGS: CERVICAL SPINE: BONES AND ALIGNMENT: Straightening of the normal cervical lordosis. Degenerative changes of the dens. No  high grade osseous spinal canal stenosis. No acute fracture or traumatic malalignment. DEGENERATIVE CHANGES: Mild disc space narrowing at C6-C7. Facet arthrosis and uncovertebral hypertrophy most pronounced at C5-C6 and C6-C7 with associated foraminal stenosis at these levels most pronounced on the left at C5-C6. SOFT TISSUES: Atherosclerosis at the carotid bifurcations. Heterogeneous appearance of the thyroid  with multiple subcentimeter nodules. Nodular soft tissue at the right lung apex along the pleura seen on series 8 image 73 and on series 4 image 20 without definite correlate on CTA from 2020. Recommend nonemergent CT of the chest for further evaluation. No prevertebral soft tissue swelling. IMPRESSION: 1. No acute abnormality of the cervical spine related to the reported neck trauma. 2. Degenerative changes as above. 3. Right apical pleural-based nodular soft tissue without definite 2020 CTA correlate. Recommend non-emergent chest CT for further evaluation. Electronically signed by: Donnice Mania MD 08/10/2024 05:07 PM EST RP Workstation: [id number]    Review of CT scan report only no acute intracranial abnormality CT Head Wo Contrast Result Date: 08/10/2024 EXAM: CT HEAD WITHOUT CONTRAST 08/10/2024 04:51:00 PM TECHNIQUE: CT of the head was performed without the administration of intravenous contrast. Automated exposure control, iterative reconstruction, and/or weight based adjustment of the mA/kV was utilized to reduce the radiation dose to as low as reasonably achievable. COMPARISON: 03/14/2008. CLINICAL HISTORY: Head trauma, minor (Age >= 65y). FINDINGS: BRAIN AND VENTRICLES: No acute hemorrhage. No evidence of acute infarct. No hydrocephalus. No extra-axial collection. No mass effect or midline shift. Intracranial atherosclerosis involving the carotid siphons and intracranial right vertebral artery. ORBITS: Bilateral lens replacement. SINUSES: No acute abnormality. SOFT TISSUES AND SKULL: No acute  soft tissue abnormality. No skull fracture. IMPRESSION: 1. No acute intracranial abnormality. Electronically signed by: Donnice Mania MD 08/10/2024 04:58 PM EST RP Workstation: [id number]    Review of chest x-ray report chronic interstitial lung markings no acute disease DG Chest Port 1 View Result Date: 08/10/2024 CLINICAL DATA:  Status post fall. EXAM: PORTABLE CHEST 1 VIEW COMPARISON:  August 02, 2013 FINDINGS: The heart size and mediastinal contours are within normal limits. Mild, diffuse, chronic appearing increased interstitial lung markings are seen. No acute infiltrate, pleural effusion or pneumothorax is identified. Multilevel degenerative changes are present throughout the thoracic spine. IMPRESSION: Chronic appearing increased interstitial lung markings without acute or active cardiopulmonary disease. Electronically Signed   By: Suzen Dials M.D.   On: 08/10/2024 16:07    My interpretation of the x-ray of the left hip 100% displacement left femoral neck fracture with varus angulation and shortening DG Hip Unilat W or Wo Pelvis 2-3 Views Left Result Date: 08/10/2024 CLINICAL DATA:  Status post fall. EXAM: DG HIP (WITH OR WITHOUT PELVIS) 2-3V LEFT COMPARISON:  None Available. FINDINGS: An acute fracture deformity is seen extending through the neck of the proximal left femur with approximately 1/2 shaft width dorsal displacement of the distal fracture site. There is no evidence of dislocation. Mild degenerative changes are seen in the form of joint space narrowing and acetabular sclerosis. IMPRESSION: Acute fracture of the proximal left femur. Electronically Signed   By: Suzen Dials M.D.   On: 08/10/2024 16:05    Review of Systems She has been relatively healthy with no chest pain shortness of breath dizziness arrhythmias  No major history of joint pain   Blood pressure (!) 144/63, pulse 89, temperature 99 F (37.2  C), temperature source Oral, resp. rate 17, height 5' 8  (1.727 m), weight 84.7 kg, SpO2 93%. Physical Exam Constitutional:      General: She is not in acute distress.    Appearance: Normal appearance. She is not ill-appearing, toxic-appearing or diaphoretic.  HENT:     Head: Normocephalic and atraumatic.     Nose: Congestion present. No rhinorrhea.     Mouth/Throat:     Pharynx: No oropharyngeal exudate or posterior oropharyngeal erythema.  Eyes:     General: No scleral icterus.    Extraocular Movements: Extraocular movements intact.     Conjunctiva/sclera: Conjunctivae normal.     Pupils: Pupils are equal, round, and reactive to light.  Cardiovascular:     Rate and Rhythm: Normal rate and regular rhythm.     Pulses: Normal pulses.  Pulmonary:     Effort: Pulmonary effort is normal. No respiratory distress.     Breath sounds: No stridor. No wheezing or rhonchi.  Abdominal:     General: Abdomen is flat.     Palpations: Abdomen is soft.  Musculoskeletal:     Cervical back: Neck supple. No rigidity or tenderness.     Comments: RLE, RUE and  LUE skin normal, no tenderness.  Range of motion normal without contracture or subluxation, no evidence of muscle weakness or tremor normal muscle tone  LLE: External rotation deformity skin normal.  Tenderness proximal thigh.  Mild swelling.  Range of motion deferred hip this fracture.  Muscle tone is normal.    Lymphadenopathy:     Cervical: No cervical adenopathy.  Skin:    Capillary Refill: Capillary refill takes less than 2 seconds.     Coloration: Skin is not jaundiced or pale.     Findings: No bruising, erythema, lesion or rash.  Neurological:     General: No focal deficit present.     Mental Status: She is alert and oriented to person, place, and time.     Cranial Nerves: No cranial nerve deficit.     Sensory: No sensory deficit.     Motor: No weakness.     Coordination: Coordination normal.     Gait: Gait abnormal.     Deep Tendon Reflexes: Reflexes normal.  Psychiatric:         Mood and Affect: Mood normal.        Behavior: Behavior normal.        Thought Content: Thought content normal.        Judgment: Judgment normal.     Assessment/Plan: Diagnosis left femoral neck fracture  83 year old female with COPD, atrial fibrillation on Eliquis  last dose was November left at 8 AM presented with left femoral neck fracture from falling.  Age and medical condition warrant bipolar replacement of the left hip  Eliquis  warrants 36-hour waiting period secondary to the procedure being high risk for bleeding.  Risk of surgery include hip dislocation bleeding infection leg length discrepancy postoperative limp  Expected recovery 1 year  Overall risk of procedure high  The procedure has been fully reviewed with the patient; The risks and benefits of surgery have been discussed and explained and understood. Alternative treatment has also been reviewed, questions were encouraged and answered. The postoperative plan is also been reviewed.     Taft Minerva 08/11/2024, 10:56 AM

## 2024-08-11 NOTE — TOC CM/SW Note (Signed)
 Transition of Care [facility]) - Inpatient Brief Assessment   Patient Details  Name: Nancy Avila MRN: [id number] Date of Birth: April 08, 1941  Transition of Care [facility]) CM/SW Contact:    Nancy Avila Phone Number: 08/11/2024, 9:52 AM   Clinical Narrative:  Patient is awaiting on Dr Margrette regarding surgery/interventions . Pending PT/OT evaluation for disposition. ICM will follow.   Transition of Care Asessment: Insurance and Status: Insurance coverage has been reviewed Patient has primary care physician: Yes Home environment has been reviewed: From Home Prior level of function:: Independent Prior/Current Home Services: No current home services Social Drivers of Health Review: SDOH reviewed no interventions necessary Readmission risk has been reviewed: Yes Transition of care needs: transition of care needs identified, TOC will continue to follow

## 2024-08-11 NOTE — Progress Notes (Signed)
 PROGRESS NOTE  Nancy Avila FMW:[id number] DOB: [birth year] DOA: 08/10/2024 PCP: Debrah Josette ORN., PA-C  Brief History:  83 year old female with a history of persistent atrial fibrillation, COPD, hyperlipidemia, anxiety presenting with a mechanical fall.  The patient was moving a chair/furniture around her house when she sustained a fall onto her left side.  She had left hip pain and difficulty getting up. At baseline, the patient is fully independent with her ADLs.  She still drives.  She lives by herself. The patient had been in her usual state of health without any complaints. Patient denies fevers, chills, headache, chest pain, dyspnea, nausea, vomiting, diarrhea, abdominal pain, dysuria, hematuria, hematochezia, and melena.  In the ED, the patient was afebrile hemodynamically stable with oxygen saturation 94% 2 L.  Apparently she had some oxygen desaturation after she was given intravenous opioids. Initial labs showed WBC 10.8, hemoglobin 13.4, plates 857.  Sodium 131, potassium 4.3, bicarbonate 26, serum creatinine 0.80.  AST 44, ALT 49, alk phosphatase 60, total bilirubin 0.7.  Orthopedic surgery, Dr. Margrette was consulted   Assessment/Plan: Left proximal femur fracture -Orthopedic surgery consulted - Remain n.p.o. - Continue IV opioids as needed for pain  Persistent atrial fibrillation - Currently rate controlled - Last dose apixaban  08/10/2024 @ 8 AM - Continue amiodarone  - Continue metoprolol  succinate  COPD - Currently stable without wheezing - Personally reviewed chest x-ray--chronic interstitial markings            Family Communication:   DTR at bedside 11/15  Consultants:  ortho--[facility]  Code Status:  FULL   DVT Prophylaxis:  apixaban  on hold   Procedures: As Listed in Progress Note Above  Antibiotics: None      Subjective: Patient denies fevers, chills, headache, chest pain, dyspnea, nausea, vomiting, diarrhea, abdominal  pain, dysuria, hematuria, hematochezia, and melena. She complains of some left hip pain.  Objective: Vitals:   08/10/24 2134 08/10/24 2318 08/11/24 0314 08/11/24 0622  BP: (!) 103/56 (!) 154/72 (!) 142/68 (!) 125/54  Pulse:  61 89   Resp: 20 19 17    Temp: 98.4 F (36.9 C) 98.5 F (36.9 C) 99 F (37.2 C)   TempSrc: Oral Oral Oral   SpO2: 91% 93% 93%   Weight:      Height:        Intake/Output Summary (Last 24 hours) at 08/11/2024 0831 Last data filed at 08/11/2024 0400 Gross per 24 hour  Intake 396.77 ml  Output --  Net 396.77 ml   Weight change:  Exam:  General:  Pt is alert, follows commands appropriately, not in acute distress HEENT: No icterus, No thrush, No neck mass, [place]/AT Cardiovascular: RRR, S1/S2, no rubs, no gallops Respiratory: Diminished breath sounds but clear to auscultation Abdomen: Soft/+BS, non tender, non distended, no guarding Extremities: No edema, No lymphangitis, No petechiae, No rashes, no synovitis   Data Reviewed: I have personally reviewed following labs and imaging studies Basic Metabolic Panel: Recent Labs  Lab 08/10/24 1545 08/11/24 0427  NA 131* 130*  K 4.3 4.5  CL 96* 95*  CO2 26 27  GLUCOSE 113* 120*  BUN 16 13  CREATININE 0.80 0.71  CALCIUM 9.2 8.7*   Liver Function Tests: Recent Labs  Lab 08/10/24 1545  AST 44*  ALT 49*  ALKPHOS 60  BILITOT 0.7  PROT 6.5  ALBUMIN 4.4   No results for input(s): LIPASE, AMYLASE in the last 168 hours. No results for input(s): AMMONIA  in the last 168 hours. Coagulation Profile: No results for input(s): INR, PROTIME in the last 168 hours. CBC: Recent Labs  Lab 08/10/24 1545 08/11/24 0427  WBC 10.6* 8.8  NEUTROABS 8.4*  --   HGB 13.4 12.9  HCT 39.1 37.5  MCV 92.2 91.5  PLT 142* 125*   Cardiac Enzymes: No results for input(s): CKTOTAL, CKMB, CKMBINDEX, TROPONINI in the last 168 hours. BNP: Invalid input(s): POCBNP CBG: No results for input(s): GLUCAP  in the last 168 hours. HbA1C: No results for input(s): HGBA1C in the last 72 hours. Urine analysis:    Component Value Date/Time   COLORURINE YELLOW 08/02/2013 1235   APPEARANCEUR CLEAR 08/02/2013 1235   LABSPEC 1.003 (L) 08/02/2013 1235   PHURINE 7.0 08/02/2013 1235   GLUCOSEU NEGATIVE 08/02/2013 1235   HGBUR NEGATIVE 08/02/2013 1235   BILIRUBINUR NEGATIVE 08/02/2013 1235   KETONESUR NEGATIVE 08/02/2013 1235   PROTEINUR NEGATIVE 08/02/2013 1235   UROBILINOGEN 0.2 08/02/2013 1235   NITRITE NEGATIVE 08/02/2013 1235   LEUKOCYTESUR NEGATIVE 08/02/2013 1235   Sepsis Labs: @LABRCNTIP (procalcitonin:4,lacticidven:4) )No results found for this or any previous visit (from the past 240 hours).   Scheduled Meds:  amiodarone   200 mg Oral Daily   docusate sodium  100 mg Oral BID   metoprolol  succinate  25 mg Oral Daily   Continuous Infusions:  acetaminophen 1,000 mg (08/11/24 0129)   lactated ringers 75 mL/hr at 08/10/24 2325    Procedures/Studies: CT Cervical Spine Wo Contrast Result Date: 08/10/2024 EXAM: CT CERVICAL SPINE WITHOUT CONTRAST 08/10/2024 04:51:00 PM TECHNIQUE: CT of the cervical spine was performed without the administration of intravenous contrast. Multiplanar reformatted images are provided for review. Automated exposure control, iterative reconstruction, and/or weight based adjustment of the mA/kV was utilized to reduce the radiation dose to as low as reasonably achievable. COMPARISON: None available. CLINICAL HISTORY: Neck trauma (Age >= 65y) FINDINGS: CERVICAL SPINE: BONES AND ALIGNMENT: Straightening of the normal cervical lordosis. Degenerative changes of the dens. No high grade osseous spinal canal stenosis. No acute fracture or traumatic malalignment. DEGENERATIVE CHANGES: Mild disc space narrowing at C6-C7. Facet arthrosis and uncovertebral hypertrophy most pronounced at C5-C6 and C6-C7 with associated foraminal stenosis at these levels most pronounced on the left at  C5-C6. SOFT TISSUES: Atherosclerosis at the carotid bifurcations. Heterogeneous appearance of the thyroid  with multiple subcentimeter nodules. Nodular soft tissue at the right lung apex along the pleura seen on series 8 image 73 and on series 4 image 20 without definite correlate on CTA from 2020. Recommend nonemergent CT of the chest for further evaluation. No prevertebral soft tissue swelling. IMPRESSION: 1. No acute abnormality of the cervical spine related to the reported neck trauma. 2. Degenerative changes as above. 3. Right apical pleural-based nodular soft tissue without definite 2020 CTA correlate. Recommend non-emergent chest CT for further evaluation. Electronically signed by: Donnice Mania MD 08/10/2024 05:07 PM EST RP Workstation: [id number]   CT Head Wo Contrast Result Date: 08/10/2024 EXAM: CT HEAD WITHOUT CONTRAST 08/10/2024 04:51:00 PM TECHNIQUE: CT of the head was performed without the administration of intravenous contrast. Automated exposure control, iterative reconstruction, and/or weight based adjustment of the mA/kV was utilized to reduce the radiation dose to as low as reasonably achievable. COMPARISON: 03/14/2008. CLINICAL HISTORY: Head trauma, minor (Age >= 65y). FINDINGS: BRAIN AND VENTRICLES: No acute hemorrhage. No evidence of acute infarct. No hydrocephalus. No extra-axial collection. No mass effect or midline shift. Intracranial atherosclerosis involving the carotid siphons and intracranial right vertebral artery. ORBITS: Bilateral lens  replacement. SINUSES: No acute abnormality. SOFT TISSUES AND SKULL: No acute soft tissue abnormality. No skull fracture. IMPRESSION: 1. No acute intracranial abnormality. Electronically signed by: Donnice Mania MD 08/10/2024 04:58 PM EST RP Workstation: [id number]   DG Chest Port 1 View Result Date: 08/10/2024 CLINICAL DATA:  Status post fall. EXAM: PORTABLE CHEST 1 VIEW COMPARISON:  August 02, 2013 FINDINGS: The heart size and mediastinal  contours are within normal limits. Mild, diffuse, chronic appearing increased interstitial lung markings are seen. No acute infiltrate, pleural effusion or pneumothorax is identified. Multilevel degenerative changes are present throughout the thoracic spine. IMPRESSION: Chronic appearing increased interstitial lung markings without acute or active cardiopulmonary disease. Electronically Signed   By: Suzen Dials M.D.   On: 08/10/2024 16:07   DG Hip Unilat W or Wo Pelvis 2-3 Views Left Result Date: 08/10/2024 CLINICAL DATA:  Status post fall. EXAM: DG HIP (WITH OR WITHOUT PELVIS) 2-3V LEFT COMPARISON:  None Available. FINDINGS: An acute fracture deformity is seen extending through the neck of the proximal left femur with approximately 1/2 shaft width dorsal displacement of the distal fracture site. There is no evidence of dislocation. Mild degenerative changes are seen in the form of joint space narrowing and acetabular sclerosis. IMPRESSION: Acute fracture of the proximal left femur. Electronically Signed   By: Suzen Dials M.D.   On: 08/10/2024 16:05    Alm Schneider, DO  [facility]  If 7PM-7AM, please contact night-coverage [email or web address] Password [email or web address] 08/11/2024, 8:31 AM   LOS: 1 day

## 2024-08-12 ENCOUNTER — Inpatient Hospital Stay (HOSPITAL_COMMUNITY): Admitting: Anesthesiology

## 2024-08-12 ENCOUNTER — Inpatient Hospital Stay (HOSPITAL_COMMUNITY)

## 2024-08-12 ENCOUNTER — Encounter (HOSPITAL_COMMUNITY)
Admission: EM | Disposition: A | Discharge status: Skilled Nursing Facility | Payer: Self-pay | Source: Home / Self Care | Attending: Internal Medicine

## 2024-08-12 DIAGNOSIS — I4819 Other persistent atrial fibrillation: Secondary | ICD-10-CM | POA: Diagnosis not present

## 2024-08-12 DIAGNOSIS — S72002K Fracture of unspecified part of neck of left femur, subsequent encounter for closed fracture with nonunion: Secondary | ICD-10-CM | POA: Diagnosis not present

## 2024-08-12 DIAGNOSIS — D6869 Other thrombophilia: Secondary | ICD-10-CM

## 2024-08-12 DIAGNOSIS — S72002A Fracture of unspecified part of neck of left femur, initial encounter for closed fracture: Secondary | ICD-10-CM | POA: Diagnosis not present

## 2024-08-12 HISTORY — PX: HIP ARTHROPLASTY: SHX981

## 2024-08-12 LAB — CBC
HCT: 36.4 % (ref 36.0–46.0)
Hemoglobin: 12.6 g/dL (ref 12.0–15.0)
MCH: 31.5 pg (ref 26.0–34.0)
MCHC: 34.6 g/dL (ref 30.0–36.0)
MCV: 91 fL (ref 80.0–100.0)
Platelets: 106 K/uL — ABNORMAL LOW (ref 150–400)
RBC: 4 MIL/uL (ref 3.87–5.11)
RDW: 13.4 % (ref 11.5–15.5)
WBC: 7.4 K/uL (ref 4.0–10.5)
nRBC: 0 % (ref 0.0–0.2)

## 2024-08-12 LAB — BASIC METABOLIC PANEL WITH GFR
Anion gap: 10 (ref 5–15)
BUN: 9 mg/dL (ref 8–23)
CO2: 24 mmol/L (ref 22–32)
Calcium: 8.2 mg/dL — ABNORMAL LOW (ref 8.9–10.3)
Chloride: 94 mmol/L — ABNORMAL LOW (ref 98–111)
Creatinine, Ser: 0.59 mg/dL (ref 0.44–1.00)
GFR, Estimated: >60 mL/min (ref >60)
Glucose, Bld: 86 mg/dL (ref 70–99)
Potassium: 3.7 mmol/L (ref 3.5–5.1)
Sodium: 128 mmol/L — ABNORMAL LOW (ref 135–145)

## 2024-08-12 LAB — MAGNESIUM: Magnesium: 2.2 mg/dL (ref 1.7–2.4)

## 2024-08-12 SURGERY — HEMIARTHROPLASTY (BIPOLAR) HIP, POSTERIOR APPROACH FOR FRACTURE
Anesthesia: General | Site: Hip | Laterality: Left

## 2024-08-12 MED ORDER — BUPIVACAINE-EPINEPHRINE (PF) 0.5% -1:200000 IJ SOLN
INTRAMUSCULAR | Status: AC
  Filled 2024-08-12: qty 30

## 2024-08-12 MED ORDER — CHLORHEXIDINE GLUCONATE 4 % EX SOLN: 1.0000 | CUTANEOUS | 1 refills | Status: AC

## 2024-08-12 MED ORDER — HYDROCODONE-ACETAMINOPHEN 5-325 MG PO TABS
1.0000 | ORAL_TABLET | ORAL | Status: DC | PRN
  Administered 2024-08-13: 2 via ORAL
  Administered 2024-08-13: 1 via ORAL
  Filled 2024-08-12: qty 1
  Filled 2024-08-12: qty 2

## 2024-08-12 MED ORDER — 0.9 % SODIUM CHLORIDE (POUR BTL) OPTIME
TOPICAL | Status: DC | PRN
  Administered 2024-08-12: 1000 mL

## 2024-08-12 MED ORDER — TRANEXAMIC ACID-NACL 1000-0.7 MG/100ML-% IV SOLN
INTRAVENOUS | Status: AC
  Filled 2024-08-12: qty 200

## 2024-08-12 MED ORDER — METOCLOPRAMIDE HCL 5 MG/ML IJ SOLN: 5.0000 mg | Freq: Three times a day (TID) | INTRAMUSCULAR | Status: DC | PRN

## 2024-08-12 MED ORDER — LACTATED RINGERS IV SOLN: INTRAVENOUS | Status: DC | PRN

## 2024-08-12 MED ORDER — CEFAZOLIN SODIUM-DEXTROSE 2-3 GM-%(50ML) IV SOLR
INTRAVENOUS | Status: DC | PRN
  Administered 2024-08-12: 2 g via INTRAVENOUS

## 2024-08-12 MED ORDER — MENTHOL 3 MG MT LOZG: 1.0000 | LOZENGE | OROMUCOSAL | Status: DC | PRN

## 2024-08-12 MED ORDER — TRANEXAMIC ACID-NACL 1000-0.7 MG/100ML-% IV SOLN: 1000.0000 mg | Freq: Once | INTRAVENOUS | Status: DC

## 2024-08-12 MED ORDER — SODIUM CHLORIDE 0.9 % IV SOLN: INTRAVENOUS | Status: DC

## 2024-08-12 MED ORDER — BUPIVACAINE-EPINEPHRINE (PF) 0.5% -1:200000 IJ SOLN
INTRAMUSCULAR | Status: DC | PRN
  Administered 2024-08-12: 30 mL via PERINEURAL

## 2024-08-12 MED ORDER — FENTANYL CITRATE (PF) 100 MCG/2ML IJ SOLN
INTRAMUSCULAR | Status: AC
  Filled 2024-08-12: qty 2

## 2024-08-12 MED ORDER — POTASSIUM CHLORIDE CRYS ER 20 MEQ PO TBCR
20.0000 meq | EXTENDED_RELEASE_TABLET | Freq: Once | ORAL | Status: AC
  Administered 2024-08-12: 20 meq via ORAL
  Filled 2024-08-12: qty 1

## 2024-08-12 MED ORDER — METHOCARBAMOL 500 MG PO TABS
500.0000 mg | ORAL_TABLET | Freq: Four times a day (QID) | ORAL | Status: DC | PRN
  Administered 2024-08-13: 500 mg via ORAL
  Filled 2024-08-12: qty 1

## 2024-08-12 MED ORDER — CEFAZOLIN SODIUM-DEXTROSE 2-4 GM/100ML-% IV SOLN
2.0000 g | Freq: Four times a day (QID) | INTRAVENOUS | Status: AC
  Administered 2024-08-12 – 2024-08-13 (×2): 2 g via INTRAVENOUS
  Filled 2024-08-12 (×2): qty 100

## 2024-08-12 MED ORDER — METHOCARBAMOL 1000 MG/10ML IJ SOLN: 500.0000 mg | Freq: Four times a day (QID) | INTRAMUSCULAR | Status: DC | PRN

## 2024-08-12 MED ORDER — ACETAMINOPHEN 500 MG PO TABS
1000.0000 mg | ORAL_TABLET | Freq: Three times a day (TID) | ORAL | Status: DC
  Administered 2024-08-12 – 2024-08-15 (×9): 1000 mg via ORAL
  Filled 2024-08-12 (×9): qty 2

## 2024-08-12 MED ORDER — METOCLOPRAMIDE HCL 10 MG PO TABS: 5.0000 mg | ORAL_TABLET | Freq: Three times a day (TID) | ORAL | Status: DC | PRN

## 2024-08-12 MED ORDER — CHLORHEXIDINE GLUCONATE 4 % EX SOLN
CUTANEOUS | Status: AC
  Administered 2024-08-12: 4 via TOPICAL
  Filled 2024-08-12: qty 15

## 2024-08-12 MED ORDER — HYDROMORPHONE HCL 1 MG/ML IJ SOLN: 0.5000 mg | INTRAMUSCULAR | Status: DC | PRN

## 2024-08-12 MED ORDER — MUPIROCIN 2 % EX OINT: 1.0000 | TOPICAL_OINTMENT | Freq: Two times a day (BID) | CUTANEOUS | 0 refills | Status: AC

## 2024-08-12 MED ORDER — FENTANYL CITRATE (PF) 100 MCG/2ML IJ SOLN
INTRAMUSCULAR | Status: DC | PRN
  Administered 2024-08-12 (×2): 50 ug via INTRAVENOUS

## 2024-08-12 MED ORDER — OXYCODONE HCL 5 MG PO TABS
5.0000 mg | ORAL_TABLET | ORAL | Status: DC | PRN
  Administered 2024-08-12 – 2024-08-13 (×3): 5 mg via ORAL
  Filled 2024-08-12 (×3): qty 1

## 2024-08-12 MED ORDER — DOCUSATE SODIUM 100 MG PO CAPS
100.0000 mg | ORAL_CAPSULE | Freq: Two times a day (BID) | ORAL | Status: DC
  Administered 2024-08-12 – 2024-08-15 (×6): 100 mg via ORAL
  Filled 2024-08-12 (×6): qty 1

## 2024-08-12 MED ORDER — PHENOL 1.4 % MT LIQD: 1.0000 | OROMUCOSAL | Status: DC | PRN

## 2024-08-12 MED ORDER — CEFAZOLIN SODIUM-DEXTROSE 2-4 GM/100ML-% IV SOLN
INTRAVENOUS | Status: AC
  Filled 2024-08-12: qty 100

## 2024-08-12 MED ORDER — TRANEXAMIC ACID-NACL 1000-0.7 MG/100ML-% IV SOLN
INTRAVENOUS | Status: DC | PRN
  Administered 2024-08-12 (×2): 1000 mg via INTRAVENOUS

## 2024-08-12 MED ORDER — APIXABAN 5 MG PO TABS
5.0000 mg | ORAL_TABLET | Freq: Two times a day (BID) | ORAL | Status: DC
  Administered 2024-08-13 – 2024-08-15 (×5): 5 mg via ORAL
  Filled 2024-08-12 (×6): qty 1

## 2024-08-12 MED ORDER — ACETAMINOPHEN 10 MG/ML IV SOLN
1000.0000 mg | Freq: Once | INTRAVENOUS | Status: AC
  Administered 2024-08-12: 1000 mg via INTRAVENOUS

## 2024-08-12 MED ORDER — HYDROCODONE-ACETAMINOPHEN 7.5-325 MG PO TABS: 1.0000 | ORAL_TABLET | ORAL | Status: DC | PRN

## 2024-08-12 MED ORDER — ACETAMINOPHEN 10 MG/ML IV SOLN
INTRAVENOUS | Status: AC
  Filled 2024-08-12: qty 100

## 2024-08-12 MED ORDER — ACETAMINOPHEN 325 MG PO TABS: 325.0000 mg | ORAL_TABLET | Freq: Four times a day (QID) | ORAL | Status: DC | PRN

## 2024-08-12 MED ORDER — TRAMADOL HCL 50 MG PO TABS
50.0000 mg | ORAL_TABLET | Freq: Four times a day (QID) | ORAL | Status: DC
  Administered 2024-08-12 – 2024-08-15 (×10): 50 mg via ORAL
  Filled 2024-08-12 (×10): qty 1

## 2024-08-12 MED ORDER — PROPOFOL 10 MG/ML IV BOLUS
INTRAVENOUS | Status: AC
  Filled 2024-08-12: qty 20

## 2024-08-12 SURGICAL SUPPLY — 53 items
BALL HIP ARTICU 28 +5 (Hips) IMPLANT
BIT DRILL 2.8X128 (BIT) ×2 IMPLANT
BLADE SAGITTAL 25.0X1.27X90 (BLADE) ×2 IMPLANT
CHLORAPREP W/TINT 26 (MISCELLANEOUS) ×2 IMPLANT
CLOTH BEACON ORANGE TIMEOUT ST (SAFETY) ×2 IMPLANT
COUNTER NDL MAGNETIC 40 RED (SET/KITS/TRAYS/PACK) ×2 IMPLANT
COUNTER NEEDLE MAGNETIC 40 RED (SET/KITS/TRAYS/PACK) ×1 IMPLANT
COVER LIGHT HANDLE (MISCELLANEOUS) IMPLANT
DRAPE HIP W/POCKET STRL (MISCELLANEOUS) ×2 IMPLANT
DRAPE U-SHAPE 47X51 STRL (DRAPES) ×2 IMPLANT
DRESSING AQUACEL AG ADV 3.5X12 (MISCELLANEOUS) ×2 IMPLANT
DRSG AQUACEL AG ADV 3.5X10 (GAUZE/BANDAGES/DRESSINGS) IMPLANT
DRSG MEPILEX SACRM 8.7X9.8 (GAUZE/BANDAGES/DRESSINGS) ×2 IMPLANT
ELECTRODE REM PT RTRN 9FT ADLT (ELECTROSURGICAL) ×2 IMPLANT
GLOVE BIO SURGEON STRL SZ 6.5 (GLOVE) IMPLANT
GLOVE BIOGEL PI IND STRL 6.5 (GLOVE) IMPLANT
GLOVE BIOGEL PI IND STRL 7.0 (GLOVE) ×4 IMPLANT
GLOVE BIOGEL PI IND STRL 7.5 (GLOVE) IMPLANT
GLOVE BIOGEL PI IND STRL 8.5 (GLOVE) ×2 IMPLANT
GLOVE SKINSENSE STRL SZ8.0 LF (GLOVE) ×4 IMPLANT
GOWN STRL REUS W/TWL LRG LVL3 (GOWN DISPOSABLE) ×4 IMPLANT
GOWN STRL REUS W/TWL XL LVL3 (GOWN DISPOSABLE) ×2 IMPLANT
HEAD BIPOLAR DEPUY 51 (Hips) IMPLANT
INST SET MAJOR BONE (KITS) ×2 IMPLANT
KIT TURNOVER KIT A (KITS) ×2 IMPLANT
MANIFOLD NEPTUNE II (INSTRUMENTS) ×2 IMPLANT
MARKER SKIN DUAL TIP RULER LAB (MISCELLANEOUS) ×2 IMPLANT
NDL HYPO 18GX1.5 BLUNT FILL (NEEDLE) ×2 IMPLANT
NDL HYPO 21X1.5 SAFETY (NEEDLE) ×2 IMPLANT
NEEDLE HYPO 18GX1.5 BLUNT FILL (NEEDLE) ×1 IMPLANT
NEEDLE HYPO 21X1.5 SAFETY (NEEDLE) ×1 IMPLANT
PACK TOTAL JOINT (CUSTOM PROCEDURE TRAY) ×2 IMPLANT
PAD ARMBOARD POSITIONER FOAM (MISCELLANEOUS) ×2 IMPLANT
PASSER SUT SWANSON 36MM LOOP (INSTRUMENTS) IMPLANT
PIN STMN SNGL STERILE 9X3.6MM (PIN) ×4 IMPLANT
POSITIONER HEAD 8X9X4 ADT (SOFTGOODS) ×2 IMPLANT
SET BASIN LINEN APH (SET/KITS/TRAYS/PACK) ×2 IMPLANT
SET HNDPC FAN SPRY TIP SCT (DISPOSABLE) ×2 IMPLANT
SOLN 0.9% NACL POUR BTL 1000ML (IV SOLUTION) ×2 IMPLANT
SOLN STERILE WATER BTL 1000 ML (IV SOLUTION) ×4 IMPLANT
SPIKE FLUID TRANSFER (MISCELLANEOUS) ×2 IMPLANT
SPONGE T-LAP 18X18 ~~LOC~~+RFID (SPONGE) IMPLANT
STAPLER VISISTAT (STAPLE) ×2 IMPLANT
STEM FEM ACTIS STD SZ4 (Stem) IMPLANT
SUT BRALON NAB BRD #1 30IN (SUTURE) ×4 IMPLANT
SUT ETHIBOND 5 LR DA (SUTURE) ×4 IMPLANT
SUT MNCRL 0 VIOLET CTX 36 (SUTURE) ×2 IMPLANT
SUT MON AB 0 CT1 (SUTURE) ×2 IMPLANT
SUT VIC AB 1 CT1 27XBRD ANTBC (SUTURE) ×8 IMPLANT
SYR 30ML LL (SYRINGE) ×2 IMPLANT
SYR BULB IRRIG 60ML STRL (SYRINGE) ×2 IMPLANT
TRAY FOLEY MTR SLVR 16FR STAT (SET/KITS/TRAYS/PACK) ×2 IMPLANT
YANKAUER SUCT 12FT TUBE ARGYLE (SUCTIONS) ×2 IMPLANT

## 2024-08-12 NOTE — Plan of Care (Signed)
  Problem: Education: Goal: Knowledge of General Education information will improve Description: Including pain rating scale, medication(s)/side effects and non-pharmacologic comfort measures Outcome: Progressing   Problem: Coping: Goal: Level of anxiety will decrease Outcome: Progressing   Problem: Pain Managment: Goal: General experience of comfort will improve and/or be controlled Outcome: Progressing

## 2024-08-12 NOTE — Plan of Care (Signed)

## 2024-08-12 NOTE — Plan of Care (Signed)

## 2024-08-12 NOTE — Brief Op Note (Signed)
 08/12/2024  9:28 AM  PATIENT:  Nancy Avila  83 y.o. female  PRE-OPERATIVE DIAGNOSIS:  left femoral neck fracture  POST-OPERATIVE DIAGNOSIS:  left femoral neck fracture  PROCEDURE:  Procedure(s): HEMIARTHROPLASTY (BIPOLAR) HIP, lateral  APPROACH FOR FRACTURE (Left)  SURGEON:  Surgeons and Role:    * Margrette Taft BRAVO, MD - Primary  PHYSICIAN ASSISTANT:   ASSISTANTS: Kristi Free   ANESTHESIA:   general  EBL:  100cc   BLOOD ADMINISTERED:none  DRAINS: none   LOCAL MEDICATIONS USED:  MARCAINE     SPECIMEN:  No Specimen  DISPOSITION OF SPECIMEN:  N/A  COUNTS:  YES  TOURNIQUET:  * No tourniquets in log *  DICTATION: .Dragon Dictation  PLAN OF CARE: Admit to inpatient   PATIENT DISPOSITION:  PACU - hemodynamically stable.   Delay start of Pharmacological VTE agent (>24hrs) due to surgical blood loss or risk of bleeding: yes

## 2024-08-12 NOTE — Progress Notes (Signed)
 PROGRESS NOTE  Nancy Avila FMW:[id number] DOB: [birth year] DOA: 08/10/2024 PCP: Debrah Josette ORN., PA-C  Brief History:  83 year old female with a history of persistent atrial fibrillation, COPD, hyperlipidemia, anxiety presenting with a mechanical fall.  The patient was moving a chair/furniture around her house when she sustained a fall onto her left side.  She had left hip pain and difficulty getting up. At baseline, the patient is fully independent with her ADLs.  She still drives.  She lives by herself. The patient had been in her usual state of health without any complaints. Patient denies fevers, chills, headache, chest pain, dyspnea, nausea, vomiting, diarrhea, abdominal pain, dysuria, hematuria, hematochezia, and melena.  In the ED, the patient was afebrile hemodynamically stable with oxygen saturation 94% 2 L.  Apparently she had some oxygen desaturation after she was given intravenous opioids. Initial labs showed WBC 10.8, hemoglobin 13.4, plates 857.  Sodium 131, potassium 4.3, bicarbonate 26, serum creatinine 0.80.  AST 44, ALT 49, alk phosphatase 60, total bilirubin 0.7.  Orthopedic surgery, Dr. Margrette was consulted   Assessment/Plan:  Left proximal femur fracture -Orthopedic surgery consulted - 11/16--left hip arthroplasty - PT eval - pt would prefer po opioids   Persistent atrial fibrillation - Currently rate controlled - Last dose apixaban  08/10/2024 @ 8 AM - Continue amiodarone  - Continue metoprolol  succinate - Keep K>4   COPD - Currently stable without wheezing - Personally reviewed chest x-ray--chronic interstitial markings                       Family Communication:   DTR at bedside 11/16   Consultants:  ortho--[facility]   Code Status:  FULL    DVT Prophylaxis:  apixaban  on hold     Procedures: As Listed in Progress Note Above   Antibiotics: None           Subjective: Pt requesting po opioids rather than IV.   Patient denies fevers, chills, headache, chest pain, dyspnea, nausea, vomiting, diarrhea, abdominal pain,   Objective: Vitals:   08/12/24 1000 08/12/24 1015 08/12/24 1043 08/12/24 1336  BP: (!) 150/66 (!) 133/53 126/71 130/64  Pulse: 68 70 66 61  Resp: (!) 21 (!) 25 17 17   Temp:   99.6 F (37.6 C)   TempSrc:   Oral   SpO2: 97% 95% 94% 92%  Weight:      Height:        Intake/Output Summary (Last 24 hours) at 08/12/2024 1719 Last data filed at 08/12/2024 1701 Gross per 24 hour  Intake 1250 ml  Output 1725 ml  Net -475 ml   Weight change:  Exam:  General:  Pt is alert, follows commands appropriately, not in acute distress HEENT: No icterus, No thrush, No neck mass, [place]/AT Cardiovascular: RRR, S1/S2, no rubs, no gallops Respiratory: bibasilar rales.  No wheeze Abdomen: Soft/+BS, non tender, non distended, no guarding Extremities: No edema, No lymphangitis, No petechiae, No rashes, no synovitis   Data Reviewed: I have personally reviewed following labs and imaging studies Basic Metabolic Panel: Recent Labs  Lab 08/10/24 1545 08/11/24 0427 08/12/24 0351  NA 131* 130* 128*  K 4.3 4.5 3.7  CL 96* 95* 94*  CO2 26 27 24   GLUCOSE 113* 120* 86  BUN 16 13 9   CREATININE 0.80 0.71 0.59  CALCIUM 9.2 8.7* 8.2*  MG  --   --  2.2   Liver Function Tests: Recent Labs  Lab 08/10/24 1545  AST 44*  ALT 49*  ALKPHOS 60  BILITOT 0.7  PROT 6.5  ALBUMIN 4.4   No results for input(s): LIPASE, AMYLASE in the last 168 hours. No results for input(s): AMMONIA in the last 168 hours. Coagulation Profile: No results for input(s): INR, PROTIME in the last 168 hours. CBC: Recent Labs  Lab 08/10/24 1545 08/11/24 0427 08/12/24 0351  WBC 10.6* 8.8 7.4  NEUTROABS 8.4*  --   --   HGB 13.4 12.9 12.6  HCT 39.1 37.5 36.4  MCV 92.2 91.5 91.0  PLT 142* 125* 106*   Cardiac Enzymes: No results for input(s): CKTOTAL, CKMB, CKMBINDEX, TROPONINI in the last 168  hours. BNP: Invalid input(s): POCBNP CBG: No results for input(s): GLUCAP in the last 168 hours. HbA1C: No results for input(s): HGBA1C in the last 72 hours. Urine analysis:    Component Value Date/Time   COLORURINE YELLOW 08/02/2013 1235   APPEARANCEUR CLEAR 08/02/2013 1235   LABSPEC 1.003 (L) 08/02/2013 1235   PHURINE 7.0 08/02/2013 1235   GLUCOSEU NEGATIVE 08/02/2013 1235   HGBUR NEGATIVE 08/02/2013 1235   BILIRUBINUR NEGATIVE 08/02/2013 1235   KETONESUR NEGATIVE 08/02/2013 1235   PROTEINUR NEGATIVE 08/02/2013 1235   UROBILINOGEN 0.2 08/02/2013 1235   NITRITE NEGATIVE 08/02/2013 1235   LEUKOCYTESUR NEGATIVE 08/02/2013 1235   Sepsis Labs: @LABRCNTIP (procalcitonin:4,lacticidven:4) ) Recent Results (from the past 240 hours)  Surgical PCR screen     Status: Abnormal   Collection Time: 08/11/24  6:04 PM   Specimen: Nasal Mucosa; Nasal Swab  Result Value Ref Range Status   MRSA, PCR NONE DETECTED (A) NEGATIVE Final   Staphylococcus aureus DETECTED (A) NEGATIVE Final    Comment: (NOTE) The Xpert SA Assay (FDA approved for NASAL specimens in patients 29 years of age and older), is one component of a comprehensive surveillance program. It is not intended to diagnose infection nor to guide or monitor treatment. Performed at [facility], [address] [place], [place] [address]      Scheduled Meds:  acetaminophen  1,000 mg Oral Q8H   amiodarone   200 mg Oral Daily   [START ON 08/13/2024] apixaban   5 mg Oral BID   docusate sodium  100 mg Oral BID   metoprolol  succinate  12.5 mg Oral Daily   mupirocin ointment  1 Application Nasal BID   traMADol  50 mg Oral Q6H   Continuous Infusions:  ceFAZolin      Procedures/Studies: DG HIP UNILAT WITH PELVIS 2-3 VIEWS LEFT Result Date: 08/12/2024 CLINICAL DATA:  Status post left hip surgery. EXAM: DG HIP (WITH OR WITHOUT PELVIS) 2-3V*L* COMPARISON:  Radiographs 08/10/2024. FINDINGS: Patient has undergone interval left hip  bipolar hemiarthroplasty. The hardware is intact and appears well positioned. No new fracture or dislocation identified mild right hip degenerative changes. Lower lumbar spondylosis noted. There is a small amount of gas within the soft tissues surrounding the left hip. Skin staples are in place. IMPRESSION: Interval left hip bipolar hemiarthroplasty without demonstrated complication. Electronically Signed   By: Elsie Perone M.D.   On: 08/12/2024 13:46   ECHOCARDIOGRAM COMPLETE Result Date: 08/11/2024    ECHOCARDIOGRAM REPORT   Patient Name:   ESTERLENE ATIYEH Date of Exam: 08/11/2024 Medical Rec #:  [id number]        Height:       68.0 in Accession #:    [id number]       Weight:       186.7 lb Date of Birth:  July 25, 1941       BSA:          1.985 m Patient Age:    82 years         BP:           125/54 mmHg Patient Gender: F                HR:           59 bpm. Exam Location:  Inpatient Procedure: 2D Echo, Cardiac Doppler and Color Doppler (Both Spectral and Color            Flow Doppler were utilized during procedure). Indications:    Z01.818 Encounter for other preprocedural examination  History:        Patient has prior history of Echocardiogram examinations, most                 recent 10/17/2023. Abnormal ECG, COPD; Arrythmias:Atrial                 Fibrillation.  Sonographer:    Ellouise Mose RDCS Referring Phys: [phone] JACOB J STINSON  Sonographer Comments: Technically difficult study due to poor echo windows. Patient supine with acute broken leg on right side of bed. Very difficult to reach patient. Could not move without pain. Rib artifact due to position. IMPRESSIONS  1. Left ventricular ejection fraction, by estimation, is 65 to 70%. The left ventricle has normal function. The left ventricle has no regional wall motion abnormalities. There is mild left ventricular hypertrophy. Left ventricular diastolic parameters are consistent with Grade I diastolic dysfunction (impaired relaxation).  2. Right ventricular  systolic function is normal. The right ventricular size is mildly enlarged. Tricuspid regurgitation signal is inadequate for assessing PA pressure.  3. The mitral valve is normal in structure. Trivial mitral valve regurgitation. No evidence of mitral stenosis.  4. The aortic valve is tricuspid. Aortic valve regurgitation is trivial. No aortic stenosis is present.  5. Aortic dilatation noted. There is dilatation of the ascending aorta, measuring 40 mm.  6. The inferior vena cava is normal in size with greater than 50% respiratory variability, suggesting right atrial pressure of 3 mmHg. FINDINGS  Left Ventricle: Left ventricular ejection fraction, by estimation, is 65 to 70%. The left ventricle has normal function. The left ventricle has no regional wall motion abnormalities. The left ventricular internal cavity size was normal in size. There is  mild left ventricular hypertrophy. Left ventricular diastolic parameters are consistent with Grade I diastolic dysfunction (impaired relaxation). Right Ventricle: The right ventricular size is mildly enlarged. No increase in right ventricular wall thickness. Right ventricular systolic function is normal. Tricuspid regurgitation signal is inadequate for assessing PA pressure. Left Atrium: Left atrial size was normal in size. Right Atrium: Right atrial size was normal in size. Pericardium: There is no evidence of pericardial effusion. Mitral Valve: The mitral valve is normal in structure. Trivial mitral valve regurgitation. No evidence of mitral valve stenosis. Tricuspid Valve: The tricuspid valve is normal in structure. Tricuspid valve regurgitation is trivial. No evidence of tricuspid stenosis. Aortic Valve: The aortic valve is tricuspid. Aortic valve regurgitation is trivial. No aortic stenosis is present. Pulmonic Valve: The pulmonic valve was not well visualized. Pulmonic valve regurgitation is trivial. No evidence of pulmonic stenosis. Aorta: Aortic dilatation noted and  the aortic root is normal in size and structure. There is dilatation of the ascending aorta, measuring 40 mm. Venous: The inferior vena cava is normal in size with greater than  50% respiratory variability, suggesting right atrial pressure of 3 mmHg. IAS/Shunts: No atrial level shunt detected by color flow Doppler.  LEFT VENTRICLE PLAX 2D LVIDd:         4.60 cm     Diastology LVIDs:         2.40 cm     LV e' medial:    6.09 cm/s LV PW:         1.00 cm     LV E/e' medial:  14.8 LV IVS:        1.00 cm     LV e' lateral:   7.40 cm/s LVOT diam:     2.00 cm     LV E/e' lateral: 12.1 LV SV:         74 LV SV Index:   37 LVOT Area:     3.14 cm LV IVRT:       95 msec  LV Volumes (MOD) LV vol d, MOD A2C: 94.1 ml LV vol d, MOD A4C: 75.8 ml LV vol s, MOD A2C: 26.3 ml LV vol s, MOD A4C: 25.9 ml LV SV MOD A2C:     67.8 ml LV SV MOD A4C:     75.8 ml LV SV MOD BP:      60.9 ml RIGHT VENTRICLE             IVC RV S prime:     13.50 cm/s  IVC diam: 1.20 cm TAPSE (M-mode): 3.2 cm LEFT ATRIUM           Index        RIGHT ATRIUM           Index LA diam:      3.20 cm 1.61 cm/m   RA Area:     16.00 cm LA Vol (A2C): 38.2 ml 19.24 ml/m  RA Volume:   35.30 ml  17.78 ml/m LA Vol (A4C): 45.7 ml 23.02 ml/m  AORTIC VALVE LVOT Vmax:   106.00 cm/s LVOT Vmean:  68.700 cm/s LVOT VTI:    0.234 m  AORTA Ao Root diam: 3.60 cm Ao Asc diam:  4.00 cm MITRAL VALVE MV Area (PHT): 2.34 cm     SHUNTS MV Decel Time: 324 msec     Systemic VTI:  0.23 m MV E velocity: 89.85 cm/s   Systemic Diam: 2.00 cm MV A velocity: 119.00 cm/s MV E/A ratio:  0.76 Lonni Nanas MD Electronically signed by Lonni Nanas MD Signature Date/Time: 08/11/2024/5:10:53 PM    Final    CT Cervical Spine Wo Contrast Result Date: 08/10/2024 EXAM: CT CERVICAL SPINE WITHOUT CONTRAST 08/10/2024 04:51:00 PM TECHNIQUE: CT of the cervical spine was performed without the administration of intravenous contrast. Multiplanar reformatted images are provided for review.  Automated exposure control, iterative reconstruction, and/or weight based adjustment of the mA/kV was utilized to reduce the radiation dose to as low as reasonably achievable. COMPARISON: None available. CLINICAL HISTORY: Neck trauma (Age >= 65y) FINDINGS: CERVICAL SPINE: BONES AND ALIGNMENT: Straightening of the normal cervical lordosis. Degenerative changes of the dens. No high grade osseous spinal canal stenosis. No acute fracture or traumatic malalignment. DEGENERATIVE CHANGES: Mild disc space narrowing at C6-C7. Facet arthrosis and uncovertebral hypertrophy most pronounced at C5-C6 and C6-C7 with associated foraminal stenosis at these levels most pronounced on the left at C5-C6. SOFT TISSUES: Atherosclerosis at the carotid bifurcations. Heterogeneous appearance of the thyroid  with multiple subcentimeter nodules. Nodular soft tissue at the right lung apex along the pleura seen on series  8 image 73 and on series 4 image 20 without definite correlate on CTA from 2020. Recommend nonemergent CT of the chest for further evaluation. No prevertebral soft tissue swelling. IMPRESSION: 1. No acute abnormality of the cervical spine related to the reported neck trauma. 2. Degenerative changes as above. 3. Right apical pleural-based nodular soft tissue without definite 2020 CTA correlate. Recommend non-emergent chest CT for further evaluation. Electronically signed by: Donnice Mania MD 08/10/2024 05:07 PM EST RP Workstation: [id number]   CT Head Wo Contrast Result Date: 08/10/2024 EXAM: CT HEAD WITHOUT CONTRAST 08/10/2024 04:51:00 PM TECHNIQUE: CT of the head was performed without the administration of intravenous contrast. Automated exposure control, iterative reconstruction, and/or weight based adjustment of the mA/kV was utilized to reduce the radiation dose to as low as reasonably achievable. COMPARISON: 03/14/2008. CLINICAL HISTORY: Head trauma, minor (Age >= 65y). FINDINGS: BRAIN AND VENTRICLES: No acute hemorrhage.  No evidence of acute infarct. No hydrocephalus. No extra-axial collection. No mass effect or midline shift. Intracranial atherosclerosis involving the carotid siphons and intracranial right vertebral artery. ORBITS: Bilateral lens replacement. SINUSES: No acute abnormality. SOFT TISSUES AND SKULL: No acute soft tissue abnormality. No skull fracture. IMPRESSION: 1. No acute intracranial abnormality. Electronically signed by: Donnice Mania MD 08/10/2024 04:58 PM EST RP Workstation: [id number]   DG Chest Port 1 View Result Date: 08/10/2024 CLINICAL DATA:  Status post fall. EXAM: PORTABLE CHEST 1 VIEW COMPARISON:  August 02, 2013 FINDINGS: The heart size and mediastinal contours are within normal limits. Mild, diffuse, chronic appearing increased interstitial lung markings are seen. No acute infiltrate, pleural effusion or pneumothorax is identified. Multilevel degenerative changes are present throughout the thoracic spine. IMPRESSION: Chronic appearing increased interstitial lung markings without acute or active cardiopulmonary disease. Electronically Signed   By: Suzen Dials M.D.   On: 08/10/2024 16:07   DG Hip Unilat W or Wo Pelvis 2-3 Views Left Result Date: 08/10/2024 CLINICAL DATA:  Status post fall. EXAM: DG HIP (WITH OR WITHOUT PELVIS) 2-3V LEFT COMPARISON:  None Available. FINDINGS: An acute fracture deformity is seen extending through the neck of the proximal left femur with approximately 1/2 shaft width dorsal displacement of the distal fracture site. There is no evidence of dislocation. Mild degenerative changes are seen in the form of joint space narrowing and acetabular sclerosis. IMPRESSION: Acute fracture of the proximal left femur. Electronically Signed   By: Suzen Dials M.D.   On: 08/10/2024 16:05    Alm Schneider, DO  [facility]  If 7PM-7AM, please contact night-coverage [email or web address] Password [email or web address] 08/12/2024, 5:19 PM   LOS: 2 days

## 2024-08-12 NOTE — Op Note (Signed)
 08/12/2024  9:28 AM  PATIENT:  Nancy Avila  83 y.o. female  PRE-OPERATIVE DIAGNOSIS:  left femoral neck fracture  POST-OPERATIVE DIAGNOSIS:  left femoral neck fracture  PROCEDURE:  Procedure(s): HEMIARTHROPLASTY (BIPOLAR) HIP, lateral  APPROACH FOR FRACTURE (Left)  SURGEON:  Surgeons and Role:    * Margrette Taft BRAVO, MD - Primary  PHYSICIAN ASSISTANT:   ASSISTANTS: Kristi Free   ANESTHESIA:   general  EBL:  100cc   BLOOD ADMINISTERED:none  DRAINS: none   LOCAL MEDICATIONS USED:  MARCAINE     SPECIMEN:  No Specimen  DISPOSITION OF SPECIMEN:  N/A  COUNTS:  YES  TOURNIQUET:  * No tourniquets in log *  DICTATION: .Dragon Dictation  PLAN OF CARE: Admit to inpatient   PATIENT DISPOSITION:  PACU - hemodynamically stable.   Delay start of Pharmacological VTE agent (>24hrs) due to surgical blood loss or risk of bleeding: yes   Orthopaedic Surgery Operative Note (CSN: [id number])  [name]  03/13/1941 Date of Surgery: 08/12/2024    Implants: Implant Name Type Inv. Item Serial No. Manufacturer Lot No. LRB No. Used Action  BALL HIP ARTICU 28 +5 - [id number] Hips BALL HIP ARTICU 28 +5  [employer] [id number] Left 1 Implanted  STEM FEM ACTIS STD SZ4 - [id number] Stem STEM FEM ACTIS STD SZ4  [employer] [id number] Left 1 Implanted  HEAD BIPOLAR DEPUY 51 - [id number] Hips HEAD BIPOLAR DEPUY 51  [employer] [id number] Left 1 Implanted     Indication for surgery displaced left femoral neck fracture   hip fracture  Transexamic  acid was given  yes    Details of surgery: The patient was identified by 2 approved identification mechanisms. The operative extremity was evaluated and found to be acceptable for surgical treatment today. The chart was reviewed. The surgical site was confirmed initials were placed over the left hip   The patient was taken to the operating room and given 2gm of Ancef . The patient was given the following  anesthetic: general   Foley catheter was inserted by nursing staff   The patient was then placed in the lateral decubitus position with appropriate padding including an axillary roll. The surgical site was prepped and draped sterilely.  Timeout was executed confirming the patient's name, surgical site, antibiotic administration, x-rays available, and implants were checked and were available.  Incision was made over the greater trochanter extended proximally and distally approximately 4 cm in each direction.  Subcutaneous tissue was divided down to fascia which was then split in line with the skin incision and deep retractors were placed.  The greater trochanteric bursa was resected exposing the abductors   The gluteus medius anterior half was subperiosteally dissected from the greater trochanter and tagged with #1 Vicryl sutures  The underlying gluteus minimus was split in continuity with the capsule and preserved tagging with Vicryl sutures as well   2 Steinmann pins were then placed in the pelvis to retract the soft tissue.  Remaining anterior capsule was excised.   The femoral head was removed and measured 51  The acetabular was inspected: no arthritis was seen   The hip was dislocated anteriorly into a sterile bag  Proximal femur was prepared starting with a femoral neck cutting guide, box osteotome, and further preparation per manufacture technique  Broaching was started with a size 1 and broached up to appropriate size based on proximal fit and fill.  This was a size 4  Trial reduction was then performed  using a +5 neck and a 51 head and 4 stem   At Trial reduction we found the hip to be stable in all positions   The trial implants were then removed 2 drill holes were placed in the greater trochanter and a #5 Ethibond was passed through the drill holes   The acetabulum was irrigated and cleaned of any bony debris, the  implants were placed and the hip was reduced  The  hip was stable throughout the range of motion.   Hip flexion  Hip extension with external rotation  Sleep position stress test  Shuck test  Leg lengths  Local anesthetic marcaine and epi  was injected in the soft tissues including the abductors, vastus fascia and skin. The capsule was repaired with # 1 vicryl the abductors  were repaired using the #5 Ethibond and then oversewn with #1 Braylon  The hip was then abducted the fascia was closed with #1 Braylon  Subcutaneous tissues were closed with 0 Monocryl and  0 Monocryl in 2 layers followed by staples  A sterile dressing was applied  The patient was taken recovery room in stable condition   Postop plan  Weightbearing as tolerated Direct lateral hip precautions DVT prophylaxis for 30 days Remove staples at 12 to 14 days Postop appointment scheduled for 28 days  [id number]

## 2024-08-12 NOTE — Plan of Care (Signed)
 Problem: Education: Goal: Knowledge of General Education information will improve Description: Including pain rating scale, medication(s)/side effects and non-pharmacologic comfort measures 08/12/2024 2050 by Rebecka Caron NOVAK, RN Outcome: Progressing 08/12/2024 2049 by Rebecka Caron NOVAK, RN Outcome: Progressing   Problem: Health Behavior/Discharge Planning: Goal: Ability to manage health-related needs will improve 08/12/2024 2050 by Rebecka Caron NOVAK, RN Outcome: Progressing 08/12/2024 2049 by Rebecka Caron NOVAK, RN Outcome: Progressing   Problem: Clinical Measurements: Goal: Ability to maintain clinical measurements within normal limits will improve 08/12/2024 2050 by Rebecka Caron NOVAK, RN Outcome: Progressing 08/12/2024 2049 by Rebecka Caron NOVAK, RN Outcome: Progressing Goal: Will remain free from infection 08/12/2024 2050 by Rebecka Caron NOVAK, RN Outcome: Progressing 08/12/2024 2049 by Rebecka Caron NOVAK, RN Outcome: Progressing Goal: Diagnostic test results will improve 08/12/2024 2050 by Rebecka Caron NOVAK, RN Outcome: Progressing 08/12/2024 2049 by Rebecka Caron NOVAK, RN Outcome: Progressing Goal: Respiratory complications will improve 08/12/2024 2050 by Rebecka Caron NOVAK, RN Outcome: Progressing 08/12/2024 2049 by Rebecka Caron NOVAK, RN Outcome: Progressing Goal: Cardiovascular complication will be avoided 08/12/2024 2050 by Rebecka Caron NOVAK, RN Outcome: Progressing 08/12/2024 2049 by Rebecka Caron NOVAK, RN Outcome: Progressing   Problem: Activity: Goal: Risk for activity intolerance will decrease 08/12/2024 2050 by Rebecka Caron NOVAK, RN Outcome: Progressing 08/12/2024 2049 by Rebecka Caron NOVAK, RN Outcome: Progressing   Problem: Nutrition: Goal: Adequate nutrition will be maintained 08/12/2024 2050 by Rebecka Caron NOVAK, RN Outcome: Progressing 08/12/2024 2049 by Rebecka Caron NOVAK, RN Outcome: Progressing   Problem: Coping: Goal: Level of anxiety will decrease 08/12/2024 2050 by Rebecka Caron NOVAK, RN Outcome:  Progressing 08/12/2024 2049 by Rebecka Caron NOVAK, RN Outcome: Progressing   Problem: Elimination: Goal: Will not experience complications related to bowel motility 08/12/2024 2050 by Rebecka Caron NOVAK, RN Outcome: Progressing 08/12/2024 2049 by Rebecka Caron NOVAK, RN Outcome: Progressing Goal: Will not experience complications related to urinary retention 08/12/2024 2050 by Rebecka Caron NOVAK, RN Outcome: Progressing 08/12/2024 2049 by Rebecka Caron NOVAK, RN Outcome: Progressing   Problem: Pain Managment: Goal: General experience of comfort will improve and/or be controlled 08/12/2024 2050 by Rebecka Caron NOVAK, RN Outcome: Progressing 08/12/2024 2049 by Rebecka Caron NOVAK, RN Outcome: Progressing   Problem: Safety: Goal: Ability to remain free from injury will improve 08/12/2024 2050 by Rebecka Caron NOVAK, RN Outcome: Progressing 08/12/2024 2049 by Rebecka Caron NOVAK, RN Outcome: Progressing   Problem: Skin Integrity: Goal: Risk for impaired skin integrity will decrease 08/12/2024 2050 by Rebecka Caron NOVAK, RN Outcome: Progressing 08/12/2024 2049 by Rebecka Caron NOVAK, RN Outcome: Progressing   Problem: Education: Goal: Verbalization of understanding the information provided (i.e., activity precautions, restrictions, etc) will improve 08/12/2024 2050 by Rebecka Caron NOVAK, RN Outcome: Progressing 08/12/2024 2049 by Rebecka Caron NOVAK, RN Outcome: Progressing Goal: Individualized Educational Video(s) 08/12/2024 2050 by Rebecka Caron NOVAK, RN Outcome: Progressing 08/12/2024 2049 by Rebecka Caron NOVAK, RN Outcome: Progressing   Problem: Activity: Goal: Ability to ambulate and perform ADLs will improve 08/12/2024 2050 by Rebecka Caron NOVAK, RN Outcome: Progressing 08/12/2024 2049 by Rebecka Caron NOVAK, RN Outcome: Progressing   Problem: Clinical Measurements: Goal: Postoperative complications will be avoided or minimized 08/12/2024 2050 by Rebecka Caron NOVAK, RN Outcome: Progressing 08/12/2024 2049 by Rebecka Caron NOVAK,  RN Outcome: Progressing   Problem: Self-Concept: Goal: Ability to maintain and perform role responsibilities to the fullest extent possible will improve 08/12/2024 2050 by Rebecka Caron NOVAK, RN Outcome: Progressing 08/12/2024 2049 by Rebecka Caron NOVAK, RN Outcome: Progressing   Problem:  Pain Management: Goal: Pain level will decrease 08/12/2024 2050 by Rebecka Caron NOVAK, RN Outcome: Progressing 08/12/2024 2049 by Rebecka Caron NOVAK, RN Outcome: Progressing

## 2024-08-12 NOTE — Anesthesia Postprocedure Evaluation (Signed)
 Anesthesia Post Note  Patient: Nancy Avila  Procedure(s) Performed: HEMIARTHROPLASTY (BIPOLAR) HIP, lateral  APPROACH FOR FRACTURE (Left: Hip)  Patient location during evaluation: PACU Anesthesia Type: General Level of consciousness: awake and alert Pain management: pain level controlled Vital Signs Assessment: post-procedure vital signs reviewed and stable Respiratory status: spontaneous breathing, nonlabored ventilation, respiratory function stable and patient connected to nasal cannula oxygen Cardiovascular status: blood pressure returned to baseline and stable Postop Assessment: no apparent nausea or vomiting Anesthetic complications: no   No notable events documented.   Last Vitals:  Vitals:   08/12/24 0522 08/12/24 0655  BP: (!) 146/56 (!) 142/60  Pulse: 62 65  Resp: 16   Temp: 36.8 C   SpO2: 93%     Last Pain:  Vitals:   08/12/24 0727  TempSrc:   PainSc: 7                  [name]

## 2024-08-12 NOTE — Anesthesia Preprocedure Evaluation (Signed)
 Anesthesia Evaluation  Patient identified by MRN, date of birth, ID band Patient awake    Reviewed: Allergy & Precautions, H&P , NPO status , Patient's Chart, lab work & pertinent test results, reviewed documented beta blocker date and time   Airway Mallampati: II  TM Distance: >3 FB Neck ROM: full    Dental no notable dental hx.    Pulmonary COPD, former smoker   Pulmonary exam normal breath sounds clear to auscultation       Cardiovascular Exercise Tolerance: Good hypertension, negative cardio ROS  Rhythm:regular Rate:Normal     Neuro/Psych   Anxiety     negative neurological ROS  negative psych ROS   GI/Hepatic negative GI ROS, Neg liver ROS,,,  Endo/Other  negative endocrine ROS    Renal/GU negative Renal ROS  negative genitourinary   Musculoskeletal   Abdominal   Peds  Hematology negative hematology ROS (+)   Anesthesia Other Findings   Reproductive/Obstetrics negative OB ROS                              Anesthesia Physical Anesthesia Plan  ASA: 3  Anesthesia Plan: General and General LMA   Post-op Pain Management:    Induction:   PONV Risk Score and Plan: Ondansetron  Airway Management Planned:   Additional Equipment:   Intra-op Plan:   Post-operative Plan:   Informed Consent: I have reviewed the patients History and Physical, chart, labs and discussed the procedure including the risks, benefits and alternatives for the proposed anesthesia with the patient or authorized representative who has indicated his/her understanding and acceptance.     Dental Advisory Given  Plan Discussed with: CRNA  Anesthesia Plan Comments:         Anesthesia Quick Evaluation

## 2024-08-12 NOTE — Transfer of Care (Signed)
 Immediate Anesthesia Transfer of Care Note  Patient: Nancy Avila  Procedure(s) Performed: HEMIARTHROPLASTY (BIPOLAR) HIP, lateral  APPROACH FOR FRACTURE (Left: Hip)  Patient Location: PACU  Anesthesia Type:General  Level of Consciousness: awake and alert   Airway & Oxygen Therapy: Patient Spontanous Breathing  Post-op Assessment: Report given to RN and Post -op Vital signs reviewed and stable  Post vital signs: Reviewed and stable  Last Vitals:  Vitals Value Taken Time  BP 166/56 08/12/24 09:30  Temp 98   Pulse 74 08/12/24 09:36  Resp 26 08/12/24 09:36  SpO2 89 % 08/12/24 09:36  Vitals shown include unfiled device data.  Last Pain:  Vitals:   08/12/24 0727  TempSrc:   PainSc: 7       Patients Stated Pain Goal: 0 (08/10/24 2318)  Complications: No notable events documented.

## 2024-08-13 ENCOUNTER — Encounter (HOSPITAL_COMMUNITY): Payer: Self-pay | Admitting: Orthopedic Surgery

## 2024-08-13 DIAGNOSIS — I4819 Other persistent atrial fibrillation: Secondary | ICD-10-CM | POA: Diagnosis not present

## 2024-08-13 DIAGNOSIS — S72002K Fracture of unspecified part of neck of left femur, subsequent encounter for closed fracture with nonunion: Secondary | ICD-10-CM | POA: Diagnosis not present

## 2024-08-13 LAB — BASIC METABOLIC PANEL WITH GFR
Anion gap: 7 (ref 5–15)
BUN: 9 mg/dL (ref 8–23)
CO2: 26 mmol/L (ref 22–32)
Calcium: 8 mg/dL — ABNORMAL LOW (ref 8.9–10.3)
Chloride: 98 mmol/L (ref 98–111)
Creatinine, Ser: 0.59 mg/dL (ref 0.44–1.00)
GFR, Estimated: >60 mL/min (ref >60)
Glucose, Bld: 119 mg/dL — ABNORMAL HIGH (ref 70–99)
Potassium: 3.9 mmol/L (ref 3.5–5.1)
Sodium: 131 mmol/L — ABNORMAL LOW (ref 135–145)

## 2024-08-13 LAB — CBC
HCT: 33.4 % — ABNORMAL LOW (ref 36.0–46.0)
Hemoglobin: 11.5 g/dL — ABNORMAL LOW (ref 12.0–15.0)
MCH: 31.3 pg (ref 26.0–34.0)
MCHC: 34.4 g/dL (ref 30.0–36.0)
MCV: 91 fL (ref 80.0–100.0)
Platelets: 96 K/uL — ABNORMAL LOW (ref 150–400)
RBC: 3.67 MIL/uL — ABNORMAL LOW (ref 3.87–5.11)
RDW: 13.5 % (ref 11.5–15.5)
WBC: 7.1 K/uL (ref 4.0–10.5)
nRBC: 0 % (ref 0.0–0.2)

## 2024-08-13 LAB — MAGNESIUM: Magnesium: 2 mg/dL (ref 1.7–2.4)

## 2024-08-13 NOTE — Evaluation (Signed)
 Occupational Therapy Evaluation Patient Details Name: Nancy Avila MRN: [id number] DOB: Apr 25, 1941 Today's Date: 08/13/2024   History of Present Illness   ESTELLE GREENLEAF is a 83 y.o. female with medical history significant of COPD, atrial fibrillation on anticoagulation, hyperglycemia.  Patient brought in via EMS due to fall at home after trying to move the chair.  This was a mechanical fall.  She did hit her head and her left hip.  She had immediate pain and was unable to move.  Here, xray shows left hip fracture.  She is on Eliquis  for atrial fibrillation and took her last dose this morning. Status post HEMIARTHROPLASTY (BIPOLAR) HIP, lateral  APPROACH FOR FRACTURE (Left). (per MD)     Clinical Impressions Pt agreeable to OT and PT co-evaluation. Pt is independent at baseline. Family reports plan to provide 24/7 assist if needed. Pt required mod A for bed mobility and EOB to chair transfer with RW. Pt unsteady with much extended time to ambulate with RW. B UE generally weak, but with [facility] functional use. Set up assist needed for seated upper body ADL's. Max A for lower body ADL's due to precautions and limitations from pain and recent surgery. Pt left in the chair with call bell within reach and family present. Pt will benefit from continued OT in the hospital to increase strength, balance, and endurance for safe ADL's.        If plan is discharge home, recommend the following:   A lot of help with walking and/or transfers;A lot of help with bathing/dressing/bathroom;Assistance with cooking/housework;Assist for transportation;Help with stairs or ramp for entrance     Functional Status Assessment   Patient has had a recent decline in their functional status and demonstrates the ability to make significant improvements in function in a reasonable and predictable amount of time.     Equipment Recommendations   None recommended by OT              Precautions/Restrictions   Precautions Precautions: Fall Recall of Precautions/Restrictions: Intact Restrictions Weight Bearing Restrictions Per Provider Order: No     Mobility Bed Mobility Overal bed mobility: Needs Assistance Bed Mobility: Supine to Sit     Supine to sit: Mod assist     General bed mobility comments: Extended time; assist for turnk control to sit upright at EOB.    Transfers Overall transfer level: Needs assistance Equipment used: Rolling walker (2 wheels) Transfers: Sit to/from Stand, Bed to chair/wheelchair/BSC Sit to Stand: Mod assist     Step pivot transfers: Mod assist     General transfer comment: Unsteady and labored movement; Difficultybearing weight on L LE.      Balance Overall balance assessment: Needs assistance Sitting-balance support: Feet supported, No upper extremity supported Sitting balance-Leahy Scale: Good Sitting balance - Comments: seated at EOB   Standing balance support: Bilateral upper extremity supported, During functional activity, Reliant on assistive device for balance Standing balance-Leahy Scale: Poor Standing balance comment: using RW                           ADL either performed or assessed with clinical judgement   ADL Overall ADL's : Needs assistance/impaired Eating/Feeding: Independent   Grooming: Set up;Sitting   Upper Body Bathing: Set up;Sitting   Lower Body Bathing: Maximal assistance;Sitting/lateral leans   Upper Body Dressing : Set up;Sitting   Lower Body Dressing: Maximal assistance;Sitting/lateral leans   Toilet Transfer: Moderate assistance;Stand-pivot;Ambulation;Rolling walker (2 wheels)  Toilet Transfer Details (indicate cue type and reason): EOB to chair and ambulation in the room with RW. Toileting- Clothing Manipulation and Hygiene: Moderate assistance;Maximal assistance;Sitting/lateral lean       Functional mobility during ADLs: Moderate assistance;Rolling walker (2  wheels) General ADL Comments: Able to ambualte a total of ~8 feet forward and backward today with RW.     Vision Baseline Vision/History: 1 Wears glasses Ability to See in Adequate Light: 1 Impaired Patient Visual Report: No change from baseline Vision Assessment?: No apparent visual deficits     Perception Perception: Not tested       Praxis Praxis: Not tested       Pertinent Vitals/Pain Pain Assessment Pain Assessment: 0-10 Pain Score: 5  Pain Location: L hip Pain Descriptors / Indicators: Aching Pain Intervention(s): Monitored during session, Repositioned, Limited activity within patient's tolerance     Extremity/Trunk Assessment Upper Extremity Assessment Upper Extremity Assessment: Generalized weakness (4-/5 bilateral shoulder flexion; generally weak otherwise.)   Lower Extremity Assessment Lower Extremity Assessment: Defer to PT evaluation   Cervical / Trunk Assessment Cervical / Trunk Assessment: Normal   Communication Communication Communication: No apparent difficulties   Cognition Arousal: Alert Behavior During Therapy: WFL for tasks assessed/performed Cognition: No apparent impairments                               Following commands: Intact       Cueing  General Comments   Cueing Techniques: Verbal cues;Tactile cues  Pt removed from supplemental O2 initially but noted to desaturate to ~83% SpO2. Pt placed back on supplemental O2 and was saturating above 90%.              Home Living Family/patient expects to be discharged to:: Private residence Living Arrangements: Alone Available Help at Discharge: Family;Available 24 hours/day Type of Home: House Home Access: Stairs to enter [employer] of Steps: 3 front ; 4 back Entrance Stairs-Rails: Right (ascending for back steps) Home Layout: One level     Bathroom Shower/Tub: Producer, Television/film/video: Standard Bathroom Accessibility: No   Home Equipment:  Agricultural Consultant (2 wheels)          Prior Functioning/Environment Prior Level of Function : Independent/Modified Independent;Driving             Mobility Comments: Tourist information centre manager without AD ADLs Comments: Independent    OT Problem List: Decreased strength;Decreased range of motion;Decreased activity tolerance;Impaired balance (sitting and/or standing);Decreased cognition;Pain   OT Treatment/Interventions: Self-care/ADL training;Therapeutic exercise;Therapeutic activities;Patient/family education;Balance training;DME and/or AE instruction      OT Goals(Current goals can be found in the care plan section)   Acute Rehab OT Goals Patient Stated Goal: Return home OT Goal Formulation: With patient Time For Goal Achievement: 08/27/24 Potential to Achieve Goals: Good   OT Frequency:  Min 2X/week    Co-evaluation PT/OT/SLP Co-Evaluation/Treatment: Yes Reason for Co-Treatment: To address functional/ADL transfers   OT goals addressed during session: ADL's and self-care                       End of Session Equipment Utilized During Treatment: Rolling walker (2 wheels);Oxygen  Activity Tolerance: Patient tolerated treatment well Patient left: in chair;with call bell/phone within reach;with family/visitor present  OT Visit Diagnosis: Unsteadiness on feet (R26.81);Muscle weakness (generalized) (M62.81);History of falling (Z91.81);Other abnormalities of gait and mobility (R26.89)  Time: 8973-8950 OT Time Calculation (min): 23 min Charges:  OT General Charges $OT Visit: 1 Visit OT Evaluation $OT Eval Low Complexity: 1 Low  Zebulan Hinshaw OT, MOT  Jayson Person 08/13/2024, 1:11 PM

## 2024-08-13 NOTE — Progress Notes (Addendum)
 PROGRESS NOTE  Nancy Avila FMW:[id number] DOB: 1941-07-02 DOA: 08/10/2024 PCP: Debrah Josette ORN., PA-C  Brief History:  83 year old female with a history of persistent atrial fibrillation, COPD, hyperlipidemia, anxiety presenting with a mechanical fall.  The patient was moving a chair/furniture around her house when she sustained a fall onto her left side.  She had left hip pain and difficulty getting up. At baseline, the patient is fully independent with her ADLs.  She still drives.  She lives by herself. The patient had been in her usual state of health without any complaints. Patient denies fevers, chills, headache, chest pain, dyspnea, nausea, vomiting, diarrhea, abdominal pain, dysuria, hematuria, hematochezia, and melena.  In the ED, the patient was afebrile hemodynamically stable with oxygen saturation 94% 2 L.  Apparently she had some oxygen desaturation after she was given intravenous opioids. Initial labs showed WBC 10.8, hemoglobin 13.4, plates 857.  Sodium 131, potassium 4.3, bicarbonate 26, serum creatinine 0.80.  AST 44, ALT 49, alk phosphatase 60, total bilirubin 0.7.  Orthopedic surgery, Dr. Margrette was consulted   Assessment/Plan:  Left proximal femur fracture -Orthopedic surgery consulted - 11/16--left hip arthroplasty - PT eval>>SNF - pt would prefer po opioids   Persistent atrial fibrillation - Currently rate controlled - Last dose apixaban  08/10/2024 @ 8 AM - Continue amiodarone  - Continue metoprolol  succinate - Keep K>4 - 11/17 restart apixaban    COPD - Currently stable without wheezing - Personally reviewed chest x-ray--chronic interstitial markings   Hyponatremia -due to volume depletion and poor solute intake -improving with IVF                   Family Communication:   DTR at bedside 11/17   Consultants:  ortho--Harrison   Code Status:  FULL    DVT Prophylaxis:  apixaban       Procedures: As Listed in Progress Note  Above   Antibiotics: None         Subjective: Patient denies fevers, chills, headache, chest pain, dyspnea, nausea, vomiting, diarrhea, abdominal pain, dysuria, hematuria, hematochezia, and melena.   Objective: Vitals:   08/12/24 2034 08/13/24 0545 08/13/24 0841 08/13/24 1359  BP: 138/60 135/62 (!) 123/57 (!) 130/59  Pulse: 62 65 64 65  Resp: 15 18  16   Temp: (!) 97.5 F (36.4 C) 98.4 F (36.9 C) 97.7 F (36.5 C) 99.2 F (37.3 C)  TempSrc: Oral Oral Oral Oral  SpO2: 90% 94% 90% 94%  Weight:      Height:        Intake/Output Summary (Last 24 hours) at 08/13/2024 1707 Last data filed at 08/13/2024 1521 Gross per 24 hour  Intake 1411.52 ml  Output --  Net 1411.52 ml   Weight change:  Exam:  General:  Pt is alert, follows commands appropriately, not in acute distress HEENT: No icterus, No thrush, No neck mass, [place]/AT Cardiovascular: RRR, S1/S2, no rubs, no gallops Respiratory: CTA bilaterally, no wheezing, no crackles, no rhonchi Abdomen: Soft/+BS, non tender, non distended, no guarding Extremities: No edema, No lymphangitis, No petechiae, No rashes, no synovitis   Data Reviewed: I have personally reviewed following labs and imaging studies Basic Metabolic Panel: Recent Labs  Lab 08/10/24 1545 08/11/24 0427 08/12/24 0351 08/13/24 0441  NA 131* 130* 128* 131*  K 4.3 4.5 3.7 3.9  CL 96* 95* 94* 98  CO2 26 27 24 26   GLUCOSE 113* 120* 86 119*  BUN 16 13 9  9  CREATININE 0.80 0.71 0.59 0.59  CALCIUM 9.2 8.7* 8.2* 8.0*  MG  --   --  2.2 2.0   Liver Function Tests: Recent Labs  Lab 08/10/24 1545  AST 44*  ALT 49*  ALKPHOS 60  BILITOT 0.7  PROT 6.5  ALBUMIN 4.4   No results for input(s): LIPASE, AMYLASE in the last 168 hours. No results for input(s): AMMONIA in the last 168 hours. Coagulation Profile: No results for input(s): INR, PROTIME in the last 168 hours. CBC: Recent Labs  Lab 08/10/24 1545 08/11/24 0427 08/12/24 0351  08/13/24 0441  WBC 10.6* 8.8 7.4 7.1  NEUTROABS 8.4*  --   --   --   HGB 13.4 12.9 12.6 11.5*  HCT 39.1 37.5 36.4 33.4*  MCV 92.2 91.5 91.0 91.0  PLT 142* 125* 106* 96*   Cardiac Enzymes: No results for input(s): CKTOTAL, CKMB, CKMBINDEX, TROPONINI in the last 168 hours. BNP: Invalid input(s): POCBNP CBG: No results for input(s): GLUCAP in the last 168 hours. HbA1C: No results for input(s): HGBA1C in the last 72 hours. Urine analysis:    Component Value Date/Time   COLORURINE YELLOW 08/02/2013 1235   APPEARANCEUR CLEAR 08/02/2013 1235   LABSPEC 1.003 (L) 08/02/2013 1235   PHURINE 7.0 08/02/2013 1235   GLUCOSEU NEGATIVE 08/02/2013 1235   HGBUR NEGATIVE 08/02/2013 1235   BILIRUBINUR NEGATIVE 08/02/2013 1235   KETONESUR NEGATIVE 08/02/2013 1235   PROTEINUR NEGATIVE 08/02/2013 1235   UROBILINOGEN 0.2 08/02/2013 1235   NITRITE NEGATIVE 08/02/2013 1235   LEUKOCYTESUR NEGATIVE 08/02/2013 1235   Sepsis Labs: @LABRCNTIP (procalcitonin:4,lacticidven:4) ) Recent Results (from the past 240 hours)  Surgical PCR screen     Status: Abnormal   Collection Time: 08/11/24  6:04 PM   Specimen: Nasal Mucosa; Nasal Swab  Result Value Ref Range Status   MRSA, PCR NONE DETECTED (A) NEGATIVE Final   Staphylococcus aureus DETECTED (A) NEGATIVE Final    Comment: (NOTE) The Xpert SA Assay (FDA approved for NASAL specimens in patients 53 years of age and older), is one component of a comprehensive surveillance program. It is not intended to diagnose infection nor to guide or monitor treatment. Performed at [facility], [address] [place], [place] [address]      Scheduled Meds:  acetaminophen  1,000 mg Oral Q8H   amiodarone   200 mg Oral Daily   apixaban   5 mg Oral BID   docusate sodium  100 mg Oral BID   metoprolol  succinate  12.5 mg Oral Daily   mupirocin ointment  1 Application Nasal BID   traMADol  50 mg Oral Q6H   Continuous Infusions:  tranexamic acid       Procedures/Studies: DG HIP UNILAT WITH PELVIS 2-3 VIEWS LEFT Result Date: 08/12/2024 CLINICAL DATA:  Status post left hip surgery. EXAM: DG HIP (WITH OR WITHOUT PELVIS) 2-3V*L* COMPARISON:  Radiographs 08/10/2024. FINDINGS: Patient has undergone interval left hip bipolar hemiarthroplasty. The hardware is intact and appears well positioned. No new fracture or dislocation identified mild right hip degenerative changes. Lower lumbar spondylosis noted. There is a small amount of gas within the soft tissues surrounding the left hip. Skin staples are in place. IMPRESSION: Interval left hip bipolar hemiarthroplasty without demonstrated complication. Electronically Signed   By: Elsie Perone M.D.   On: 08/12/2024 13:46   ECHOCARDIOGRAM COMPLETE Result Date: 08/11/2024    ECHOCARDIOGRAM REPORT   Patient Name:   TAWONA FILSINGER Date of Exam: 08/11/2024 Medical Rec #:  [id number]  Height:       68.0 in Accession #:    [id number]       Weight:       186.7 lb Date of Birth:  [birth year]       BSA:          1.985 m Patient Age:    82 years         BP:           125/54 mmHg Patient Gender: F                HR:           59 bpm. Exam Location:  Inpatient Procedure: 2D Echo, Cardiac Doppler and Color Doppler (Both Spectral and Color            Flow Doppler were utilized during procedure). Indications:    Z01.818 Encounter for other preprocedural examination  History:        Patient has prior history of Echocardiogram examinations, most                 recent 10/17/2023. Abnormal ECG, COPD; Arrythmias:Atrial                 Fibrillation.  Sonographer:    Ellouise Mose RDCS Referring Phys: [phone] JACOB J STINSON  Sonographer Comments: Technically difficult study due to poor echo windows. Patient supine with acute broken leg on right side of bed. Very difficult to reach patient. Could not move without pain. Rib artifact due to position. IMPRESSIONS  1. Left ventricular ejection fraction, by estimation, is 65 to 70%. The  left ventricle has normal function. The left ventricle has no regional wall motion abnormalities. There is mild left ventricular hypertrophy. Left ventricular diastolic parameters are consistent with Grade I diastolic dysfunction (impaired relaxation).  2. Right ventricular systolic function is normal. The right ventricular size is mildly enlarged. Tricuspid regurgitation signal is inadequate for assessing PA pressure.  3. The mitral valve is normal in structure. Trivial mitral valve regurgitation. No evidence of mitral stenosis.  4. The aortic valve is tricuspid. Aortic valve regurgitation is trivial. No aortic stenosis is present.  5. Aortic dilatation noted. There is dilatation of the ascending aorta, measuring 40 mm.  6. The inferior vena cava is normal in size with greater than 50% respiratory variability, suggesting right atrial pressure of 3 mmHg. FINDINGS  Left Ventricle: Left ventricular ejection fraction, by estimation, is 65 to 70%. The left ventricle has normal function. The left ventricle has no regional wall motion abnormalities. The left ventricular internal cavity size was normal in size. There is  mild left ventricular hypertrophy. Left ventricular diastolic parameters are consistent with Grade I diastolic dysfunction (impaired relaxation). Right Ventricle: The right ventricular size is mildly enlarged. No increase in right ventricular wall thickness. Right ventricular systolic function is normal. Tricuspid regurgitation signal is inadequate for assessing PA pressure. Left Atrium: Left atrial size was normal in size. Right Atrium: Right atrial size was normal in size. Pericardium: There is no evidence of pericardial effusion. Mitral Valve: The mitral valve is normal in structure. Trivial mitral valve regurgitation. No evidence of mitral valve stenosis. Tricuspid Valve: The tricuspid valve is normal in structure. Tricuspid valve regurgitation is trivial. No evidence of tricuspid stenosis. Aortic  Valve: The aortic valve is tricuspid. Aortic valve regurgitation is trivial. No aortic stenosis is present. Pulmonic Valve: The pulmonic valve was not well visualized. Pulmonic valve regurgitation is trivial. No evidence of pulmonic stenosis. Aorta: Aortic  dilatation noted and the aortic root is normal in size and structure. There is dilatation of the ascending aorta, measuring 40 mm. Venous: The inferior vena cava is normal in size with greater than 50% respiratory variability, suggesting right atrial pressure of 3 mmHg. IAS/Shunts: No atrial level shunt detected by color flow Doppler.  LEFT VENTRICLE PLAX 2D LVIDd:         4.60 cm     Diastology LVIDs:         2.40 cm     LV e' medial:    6.09 cm/s LV PW:         1.00 cm     LV E/e' medial:  14.8 LV IVS:        1.00 cm     LV e' lateral:   7.40 cm/s LVOT diam:     2.00 cm     LV E/e' lateral: 12.1 LV SV:         74 LV SV Index:   37 LVOT Area:     3.14 cm LV IVRT:       95 msec  LV Volumes (MOD) LV vol d, MOD A2C: 94.1 ml LV vol d, MOD A4C: 75.8 ml LV vol s, MOD A2C: 26.3 ml LV vol s, MOD A4C: 25.9 ml LV SV MOD A2C:     67.8 ml LV SV MOD A4C:     75.8 ml LV SV MOD BP:      60.9 ml RIGHT VENTRICLE             IVC RV S prime:     13.50 cm/s  IVC diam: 1.20 cm TAPSE (M-mode): 3.2 cm LEFT ATRIUM           Index        RIGHT ATRIUM           Index LA diam:      3.20 cm 1.61 cm/m   RA Area:     16.00 cm LA Vol (A2C): 38.2 ml 19.24 ml/m  RA Volume:   35.30 ml  17.78 ml/m LA Vol (A4C): 45.7 ml 23.02 ml/m  AORTIC VALVE LVOT Vmax:   106.00 cm/s LVOT Vmean:  68.700 cm/s LVOT VTI:    0.234 m  AORTA Ao Root diam: 3.60 cm Ao Asc diam:  4.00 cm MITRAL VALVE MV Area (PHT): 2.34 cm     SHUNTS MV Decel Time: 324 msec     Systemic VTI:  0.23 m MV E velocity: 89.85 cm/s   Systemic Diam: 2.00 cm MV A velocity: 119.00 cm/s MV E/A ratio:  0.76 Lonni Nanas MD Electronically signed by Lonni Nanas MD Signature Date/Time: 08/11/2024/5:10:53 PM    Final    CT  Cervical Spine Wo Contrast Result Date: 08/10/2024 EXAM: CT CERVICAL SPINE WITHOUT CONTRAST 08/10/2024 04:51:00 PM TECHNIQUE: CT of the cervical spine was performed without the administration of intravenous contrast. Multiplanar reformatted images are provided for review. Automated exposure control, iterative reconstruction, and/or weight based adjustment of the mA/kV was utilized to reduce the radiation dose to as low as reasonably achievable. COMPARISON: None available. CLINICAL HISTORY: Neck trauma (Age >= 65y) FINDINGS: CERVICAL SPINE: BONES AND ALIGNMENT: Straightening of the normal cervical lordosis. Degenerative changes of the dens. No high grade osseous spinal canal stenosis. No acute fracture or traumatic malalignment. DEGENERATIVE CHANGES: Mild disc space narrowing at C6-C7. Facet arthrosis and uncovertebral hypertrophy most pronounced at C5-C6 and C6-C7 with associated foraminal stenosis at these levels most pronounced on  the left at C5-C6. SOFT TISSUES: Atherosclerosis at the carotid bifurcations. Heterogeneous appearance of the thyroid  with multiple subcentimeter nodules. Nodular soft tissue at the right lung apex along the pleura seen on series 8 image 73 and on series 4 image 20 without definite correlate on CTA from 2020. Recommend nonemergent CT of the chest for further evaluation. No prevertebral soft tissue swelling. IMPRESSION: 1. No acute abnormality of the cervical spine related to the reported neck trauma. 2. Degenerative changes as above. 3. Right apical pleural-based nodular soft tissue without definite 2020 CTA correlate. Recommend non-emergent chest CT for further evaluation. Electronically signed by: Donnice Mania MD 08/10/2024 05:07 PM EST RP Workstation: [id number]   CT Head Wo Contrast Result Date: 08/10/2024 EXAM: CT HEAD WITHOUT CONTRAST 08/10/2024 04:51:00 PM TECHNIQUE: CT of the head was performed without the administration of intravenous contrast. Automated exposure control,  iterative reconstruction, and/or weight based adjustment of the mA/kV was utilized to reduce the radiation dose to as low as reasonably achievable. COMPARISON: 03/14/2008. CLINICAL HISTORY: Head trauma, minor (Age >= 65y). FINDINGS: BRAIN AND VENTRICLES: No acute hemorrhage. No evidence of acute infarct. No hydrocephalus. No extra-axial collection. No mass effect or midline shift. Intracranial atherosclerosis involving the carotid siphons and intracranial right vertebral artery. ORBITS: Bilateral lens replacement. SINUSES: No acute abnormality. SOFT TISSUES AND SKULL: No acute soft tissue abnormality. No skull fracture. IMPRESSION: 1. No acute intracranial abnormality. Electronically signed by: Donnice Mania MD 08/10/2024 04:58 PM EST RP Workstation: [id number]   DG Chest Port 1 View Result Date: 08/10/2024 CLINICAL DATA:  Status post fall. EXAM: PORTABLE CHEST 1 VIEW COMPARISON:  August 02, 2013 FINDINGS: The heart size and mediastinal contours are within normal limits. Mild, diffuse, chronic appearing increased interstitial lung markings are seen. No acute infiltrate, pleural effusion or pneumothorax is identified. Multilevel degenerative changes are present throughout the thoracic spine. IMPRESSION: Chronic appearing increased interstitial lung markings without acute or active cardiopulmonary disease. Electronically Signed   By: Suzen Dials M.D.   On: 08/10/2024 16:07   DG Hip Unilat W or Wo Pelvis 2-3 Views Left Result Date: 08/10/2024 CLINICAL DATA:  Status post fall. EXAM: DG HIP (WITH OR WITHOUT PELVIS) 2-3V LEFT COMPARISON:  None Available. FINDINGS: An acute fracture deformity is seen extending through the neck of the proximal left femur with approximately 1/2 shaft width dorsal displacement of the distal fracture site. There is no evidence of dislocation. Mild degenerative changes are seen in the form of joint space narrowing and acetabular sclerosis. IMPRESSION: Acute fracture of the  proximal left femur. Electronically Signed   By: Suzen Dials M.D.   On: 08/10/2024 16:05    Alm Schneider, DO  [facility]  If 7PM-7AM, please contact night-coverage [email or web address] Password [email or web address] 08/13/2024, 5:07 PM   LOS: 3 days

## 2024-08-13 NOTE — Evaluation (Signed)
 Physical Therapy Evaluation Patient Details Name: Nancy Avila MRN: [id number] DOB: May 07, 1941 Today's Date: 08/13/2024  History of Present Illness  Nancy Avila is a 83 y.o. female with medical history significant of COPD, atrial fibrillation on anticoagulation, hyperglycemia.  Patient brought in via EMS due to fall at home after trying to move the chair.  This was a mechanical fall.  She did hit her head and her left hip.  She had immediate pain and was unable to move.  Here, xray shows left hip fracture.  She is on Eliquis  for atrial fibrillation and took her last dose this morning. Status post HEMIARTHROPLASTY (BIPOLAR) HIP, lateral  APPROACH FOR FRACTURE (Left).   Clinical Impression  Patient demonstrates slow labored movement for sitting up at bedside with difficulty moving LLE due to pain, limited to a few steps forward/backwards at bedside before having to sit due to fatigue, increasing left hip pain and SpO2 dropping from 92% to 85% while on room air. Patient put back on 2 LPM O2 with SpO2 at 89% with family members present after therapy. Patient will benefit from continued skilled physical therapy in hospital and recommended venue below to increase strength, balance, endurance for safe ADLs and gait.          If plan is discharge home, recommend the following: A lot of help with bathing/dressing/bathroom;A lot of help with walking and/or transfers;Help with stairs or ramp for entrance;Assist for transportation;Assistance with cooking/housework   Can travel by private vehicle   No    Equipment Recommendations None recommended by PT  Recommendations for Other Services       Functional Status Assessment Patient has had a recent decline in their functional status and demonstrates the ability to make significant improvements in function in a reasonable and predictable amount of time.     Precautions / Restrictions Precautions Precautions: Fall Recall of  Precautions/Restrictions: Intact Restrictions Weight Bearing Restrictions Per Provider Order: No      Mobility  Bed Mobility Overal bed mobility: Needs Assistance Bed Mobility: Supine to Sit     Supine to sit: Mod assist     General bed mobility comments: increased time, labored movement with difficulty moving LLE due to pain    Transfers Overall transfer level: Needs assistance Equipment used: Rolling walker (2 wheels) Transfers: Sit to/from Stand, Bed to chair/wheelchair/BSC Sit to Stand: Mod assist   Step pivot transfers: Mod assist       General transfer comment: Unsteady and labored movement; Difficultybearing weight on L LE.    Ambulation/Gait Ambulation/Gait assistance: Mod assist Gait Distance (Feet): 6 Feet Assistive device: Rolling walker (2 wheels) Gait Pattern/deviations: Decreased step length - right, Decreased step length - left, Decreased stride length, Decreased stance time - left, Antalgic Gait velocity: slow     General Gait Details: limited to a few steps forward/backwards at bedside before having to sit due to fatigue, weakness and SpO2 dropping from92% to 85% while on room air  Stairs            Wheelchair Mobility     Tilt Bed    Modified Rankin (Stroke Patients Only)       Balance Overall balance assessment: Needs assistance Sitting-balance support: Feet supported, No upper extremity supported Sitting balance-Leahy Scale: Good Sitting balance - Comments: seated at EOB   Standing balance support: Bilateral upper extremity supported, During functional activity, Reliant on assistive device for balance Standing balance-Leahy Scale: Poor Standing balance comment: using RW  Pertinent Vitals/Pain Pain Assessment Pain Assessment: 0-10 Pain Score: 5  Pain Location: L hip Pain Descriptors / Indicators: Aching Pain Intervention(s): Limited activity within patient's tolerance, Monitored during  session, Repositioned    Home Living Family/patient expects to be discharged to:: Private residence Living Arrangements: Alone Available Help at Discharge: Family;Available 24 hours/day Type of Home: House Home Access: Stairs to enter Entrance Stairs-Rails: Right Entrance Stairs-Number of Steps: 3 front ; 4 back   Home Layout: One level Home Equipment: Agricultural Consultant (2 wheels)      Prior Function Prior Level of Function : Independent/Modified Independent;Driving             Mobility Comments: Tourist information centre manager without AD ADLs Comments: Independent     Extremity/Trunk Assessment   Upper Extremity Assessment Upper Extremity Assessment: Defer to OT evaluation    Lower Extremity Assessment Lower Extremity Assessment: Generalized weakness;LLE deficits/detail LLE Deficits / Details: grossly -4/5 LLE: Unable to fully assess due to pain LLE Sensation: WNL LLE Coordination: WNL    Cervical / Trunk Assessment Cervical / Trunk Assessment: Normal  Communication   Communication Communication: No apparent difficulties    Cognition Arousal: Alert Behavior During Therapy: WFL for tasks assessed/performed   PT - Cognitive impairments: No apparent impairments                         Following commands: Intact       Cueing Cueing Techniques: Verbal cues, Tactile cues     General Comments General comments (skin integrity, edema, etc.): Pt removed from supplemental O2 initially but noted to desaturate to ~83% SpO2. Pt placed back on supplemental O2 and was saturating above 90%.    Exercises     Assessment/Plan    PT Assessment Patient needs continued PT services  PT Problem List Decreased strength;Decreased activity tolerance;Decreased balance;Decreased mobility;Pain       PT Treatment Interventions DME instruction;Gait training;Functional mobility training;Stair training;Therapeutic activities;Therapeutic exercise;Balance training;Patient/family  education    PT Goals (Current goals can be found in the Care Plan section)  Acute Rehab PT Goals Patient Stated Goal: return home after rehab PT Goal Formulation: With patient/family Time For Goal Achievement: 08/27/24 Potential to Achieve Goals: Good    Frequency Min 3X/week     Co-evaluation PT/OT/SLP Co-Evaluation/Treatment: Yes Reason for Co-Treatment: To address functional/ADL transfers PT goals addressed during session: Mobility/safety with mobility;Balance;Proper use of DME OT goals addressed during session: ADL's and self-care       AM-PAC PT 6 Clicks Mobility  Outcome Measure Help needed turning from your back to your side while in a flat bed without using bedrails?: A Lot Help needed moving from lying on your back to sitting on the side of a flat bed without using bedrails?: A Lot Help needed moving to and from a bed to a chair (including a wheelchair)?: A Lot Help needed standing up from a chair using your arms (e.g., wheelchair or bedside chair)?: A Lot Help needed to walk in hospital room?: A Lot Help needed climbing 3-5 steps with a railing? : A Lot 6 Click Score: 12    End of Session Equipment Utilized During Treatment: Oxygen Activity Tolerance: Patient tolerated treatment well;Patient limited by fatigue;Patient limited by pain Patient left: in chair;with call bell/phone within reach;with family/visitor present Nurse Communication: Mobility status PT Visit Diagnosis: Unsteadiness on feet (R26.81);Other abnormalities of gait and mobility (R26.89);Muscle weakness (generalized) (M62.81)    Time: 8974-8951 PT Time Calculation (min) (ACUTE ONLY): 23  min   Charges:   PT Evaluation $PT Eval Moderate Complexity: 1 Mod PT Treatments $Therapeutic Activity: 23-37 mins PT General Charges $$ ACUTE PT VISIT: 1 Visit         2:06 PM, 08/13/24 Lynwood Music, MPT Physical Therapist with [facility] [phone] office [phone] mobile  phone

## 2024-08-13 NOTE — Telephone Encounter (Signed)
 Medical equipment needs to come from the surgeon per Monica Aho, PA-C. Patient is currently admitted to the hospital.

## 2024-08-13 NOTE — TOC Initial Note (Signed)
 Transition of Care [facility]) - Initial/Assessment Note    Patient Details  Name: Nancy Avila MRN: [id number] Date of Birth: 07-Nov-1940  Transition of Care [facility]) CM/SW Contact:    Lucie Lunger, LCSWA Phone Number: 08/13/2024, 11:29 AM  Clinical Narrative:                 CSW updated that PT is recommending SNF placement for pt. CSW met with pt and family at bedside to review, at this time pt is agreeable to SNF referral being sent out and prefers placement at [facility]. CSW to send out SNF referral for review. TOC to follow.   Expected Discharge Plan: Skilled Nursing Facility Barriers to Discharge: Continued Medical Work up   Patient Goals and CMS Choice Patient states their goals for this hospitalization and ongoing recovery are:: go to SNF CMS Medicare.gov Compare Post Acute Care list provided to:: Patient Choice offered to / list presented to : Patient      Expected Discharge Plan and Services In-house Referral: Clinical Social Work Discharge Planning Services: CM Consult Post Acute Care Choice: Skilled Nursing Facility Living arrangements for the past 2 months: Single Family Home                                      Prior Living Arrangements/Services Living arrangements for the past 2 months: Single Family Home Lives with:: Self Patient language and need for interpreter reviewed:: Yes Do you feel safe going back to the place where you live?: Yes      Need for Family Participation in Patient Care: Yes (Comment) Care giver support system in place?: Yes (comment)   Criminal Activity/Legal Involvement Pertinent to Current Situation/Hospitalization: No - Comment as needed  Activities of Daily Living   ADL Screening (condition at time of admission) Independently performs ADLs?: No Does the patient have a NEW difficulty with bathing/dressing/toileting/self-feeding that is expected to last >3 days?: Yes (Initiates electronic notice to provider for possible  OT consult) Does the patient have a NEW difficulty with getting in/out of bed, walking, or climbing stairs that is expected to last >3 days?: Yes (Initiates electronic notice to provider for possible PT consult) Does the patient have a NEW difficulty with communication that is expected to last >3 days?: No Is the patient deaf or have difficulty hearing?: No Does the patient have difficulty seeing, even when wearing glasses/contacts?: No Does the patient have difficulty concentrating, remembering, or making decisions?: No  Permission Sought/Granted                  Emotional Assessment Appearance:: Appears stated age Attitude/Demeanor/Rapport: Engaged Affect (typically observed): Accepting Orientation: : Oriented to Self, Oriented to Place, Oriented to  Time, Oriented to Situation Alcohol / Substance Use: Not Applicable Psych Involvement: No (comment)  Admission diagnosis:  Closed left hip fracture (HCC) [S72.002A] Closed fracture of left hip, initial encounter [facility]) [S72.002A] Patient Active Problem List   Diagnosis Date Noted   Closed left hip fracture (HCC) 08/10/2024   COPD (chronic obstructive pulmonary disease) (HCC)    Encounter for monitoring amiodarone  therapy 12/15/2023   Persistent atrial fibrillation (HCC) 10/25/2023   Hypercoagulable state due to persistent atrial fibrillation (HCC) 10/25/2023   Viral conjunctivitis of right eye 01/28/2022   Eye pain, left 12/23/2020   Cystoid macular edema of left eye 06/11/2020   PCP:  Debrah Josette ORN., PA-C Pharmacy:   [id number] - [place], [place] - [address] [place] [address] Phone: [phone] Fax: [phone]     Social Drivers of Health (SDOH) Social History: SDOH Screenings   Food Insecurity: No Food Insecurity (08/10/2024)  Housing: Low Risk  (08/10/2024)  Transportation Needs: No Transportation Needs (08/10/2024)  Utilities: Not At  Risk (08/10/2024)  Social Connections: Moderately Integrated (08/10/2024)  Tobacco Use: Medium Risk (08/10/2024)   SDOH Interventions:     Readmission Risk Interventions    08/13/2024   11:28 AM 08/11/2024    9:51 AM  Readmission Risk Prevention Plan  Medication Screening  Complete  Transportation Screening Complete Complete  Home Care Screening Complete   Medication Review (RN CM) Complete

## 2024-08-13 NOTE — Plan of Care (Signed)
  Problem: Acute Rehab OT Goals (only OT should resolve) Goal: Pt. Will Perform Grooming Flowsheets (Taken 08/13/2024 1316) Pt Will Perform Grooming:  with contact guard assist  standing Goal: Pt. Will Perform Lower Body Bathing Flowsheets (Taken 08/13/2024 1316) Pt Will Perform Lower Body Bathing:  with min assist  sitting/lateral leans  with adaptive equipment Goal: Pt. Will Perform Lower Body Dressing Flowsheets (Taken 08/13/2024 1316) Pt Will Perform Lower Body Dressing:  with min assist  with adaptive equipment  sitting/lateral leans Goal: Pt. Will Transfer To Toilet Flowsheets (Taken 08/13/2024 1316) Pt Will Transfer to Toilet:  with contact guard assist  stand pivot transfer  ambulating Goal: Pt. Will Perform Toileting-Clothing Manipulation Flowsheets (Taken 08/13/2024 1316) Pt Will Perform Toileting - Clothing Manipulation and hygiene:  with contact guard assist  sitting/lateral leans Goal: Pt/Caregiver Will Perform Home Exercise Program Flowsheets (Taken 08/13/2024 1316) Pt/caregiver will Perform Home Exercise Program:  Increased strength  Both right and left upper extremity  Independently  Maci Eickholt OT, MOT

## 2024-08-13 NOTE — NC FL2 (Signed)
 [place]  MEDICAID FL2 LEVEL OF CARE FORM     IDENTIFICATION  Patient Name: Nancy Avila Birthdate: 04-27-1941 Sex: female Admission Date (Current Location): 08/10/2024  [facility] and [place] Number:  [employer] and Address:  [facility],  [address], [place] [address]      Provider Number: [id number]  Attending Physician Name and Address:  Evonnie Lenis, MD  Relative Name and Phone Number:  Moishe Browning (Daughter)  [phone]    Current Level of Care: Hospital Recommended Level of Care: Skilled Nursing Facility Prior Approval Number:    Date Approved/Denied:   PASRR Number: [id number]  Discharge Plan: SNF    Current Diagnoses: Patient Active Problem List   Diagnosis Date Noted   Closed left hip fracture (HCC) 08/10/2024   COPD (chronic obstructive pulmonary disease) (HCC)    Encounter for monitoring amiodarone  therapy 12/15/2023   Persistent atrial fibrillation (HCC) 10/25/2023   Hypercoagulable state due to persistent atrial fibrillation (HCC) 10/25/2023   Viral conjunctivitis of right eye 01/28/2022   Eye pain, left 12/23/2020   Cystoid macular edema of left eye 06/11/2020    Orientation RESPIRATION BLADDER Height & Weight     Self, Time, Situation, Place  O2 (2L O2) Continent Weight: 186 lb 11.7 oz (84.7 kg) Height:  5' 8 (172.7 cm)  BEHAVIORAL SYMPTOMS/MOOD NEUROLOGICAL BOWEL NUTRITION STATUS      Continent Diet (See D/C summary)  AMBULATORY STATUS COMMUNICATION OF NEEDS Skin   Extensive Assist Verbally Normal                       Personal Care Assistance Level of Assistance  Bathing, Feeding, Dressing Bathing Assistance: Limited assistance Feeding assistance: Independent Dressing Assistance: Limited assistance     Functional Limitations Info  Sight, Hearing, Speech Sight Info: Impaired Hearing Info: Adequate Speech Info: Adequate    SPECIAL CARE FACTORS FREQUENCY  PT (By licensed PT), OT (By licensed  OT)     PT Frequency: 5 times weekly OT Frequency: 5 times weekly            Contractures Contractures Info: Not present    Additional Factors Info  Code Status, Allergies Code Status Info: FULL Allergies Info: Codeine and nyquil           Current Medications (08/13/2024):  This is the current hospital active medication list Current Facility-Administered Medications  Medication Dose Route Frequency Provider Last Rate Last Admin   acetaminophen (TYLENOL) tablet 1,000 mg  1,000 mg Oral Q8H Tat, Lenis, MD   1,000 mg at 08/13/24 0606   acetaminophen (TYLENOL) tablet 325-650 mg  325-650 mg Oral Q6H PRN Harrison, Stanley E, MD       albuterol (PROVENTIL) (2.5 MG/3ML) 0.083% nebulizer solution 3 mL  3 mL Inhalation Q6H PRN Margrette Taft BRAVO, MD       amiodarone  (PACERONE ) tablet 200 mg  200 mg Oral Daily Margrette Taft BRAVO, MD   200 mg at 08/13/24 9153   apixaban  (ELIQUIS ) tablet 5 mg  5 mg Oral BID Margrette Taft BRAVO, MD   5 mg at 08/13/24 0845   bisacodyl (DULCOLAX) suppository 10 mg  10 mg Rectal Daily PRN Harrison, Stanley E, MD       docusate sodium (COLACE) capsule 100 mg  100 mg Oral BID Harrison, Stanley E, MD   100 mg at 08/13/24 0847   HYDROcodone-acetaminophen (NORCO) 7.5-325 MG per tablet 1-2 tablet  1-2 tablet Oral Q4H PRN Margrette Taft BRAVO, MD  HYDROcodone-acetaminophen (NORCO/VICODIN) 5-325 MG per tablet 1-2 tablet  1-2 tablet Oral Q4H PRN Harrison, Stanley E, MD   2 tablet at 08/13/24 0848   HYDROmorphone (DILAUDID) injection 0.5-1 mg  0.5-1 mg Intravenous Q2H PRN Harrison, Stanley E, MD       menthol (CEPACOL) lozenge 3 mg  1 lozenge Oral PRN Harrison, Stanley E, MD       Or   phenol (CHLORASEPTIC) mouth spray 1 spray  1 spray Mouth/Throat PRN Margrette Taft BRAVO, MD       methocarbamol (ROBAXIN) tablet 500 mg  500 mg Oral Q6H PRN Margrette Taft BRAVO, MD       Or   methocarbamol (ROBAXIN) injection 500 mg  500 mg Intravenous Q6H PRN Harrison, Stanley E, MD        metoCLOPramide (REGLAN) tablet 5-10 mg  5-10 mg Oral Q8H PRN Harrison, Stanley E, MD       Or   metoCLOPramide (REGLAN) injection 5-10 mg  5-10 mg Intravenous Q8H PRN Harrison, Stanley E, MD       metoprolol  succinate (TOPROL -XL) 24 hr tablet 12.5 mg  12.5 mg Oral Daily Margrette Taft BRAVO, MD   12.5 mg at 08/13/24 0844   mupirocin ointment (BACTROBAN) 2 % 1 Application  1 Application Nasal BID Margrette Taft BRAVO, MD   1 Application at 08/13/24 0850   ondansetron (ZOFRAN) tablet 4 mg  4 mg Oral Q6H PRN Harrison, Stanley E, MD       Or   ondansetron (ZOFRAN) injection 4 mg  4 mg Intravenous Q6H PRN Harrison, Stanley E, MD   4 mg at 08/12/24 9766   oxyCODONE (Oxy IR/ROXICODONE) immediate release tablet 5 mg  5 mg Oral Q4H PRN Evonnie Lenis, MD   5 mg at 08/13/24 0337   traMADol (ULTRAM) tablet 50 mg  50 mg Oral Q6H Margrette Taft BRAVO, MD   50 mg at 08/13/24 1259   tranexamic acid (CYKLOKAPRON) IVPB 1,000 mg  1,000 mg Intravenous Once Margrette Taft BRAVO, MD         Discharge Medications: Please see discharge summary for a list of discharge medications.  Relevant Imaging Results:  Relevant Lab Results:   Additional Information SSN: [id number] LCSWA

## 2024-08-13 NOTE — Plan of Care (Signed)

## 2024-08-13 NOTE — Plan of Care (Signed)
  Problem: Acute Rehab PT Goals(only PT should resolve) Goal: Pt Will Go Supine/Side To Sit Outcome: Progressing Flowsheets (Taken 08/13/2024 1407) Pt will go Supine/Side to Sit:  with minimal assist  with moderate assist Goal: Patient Will Transfer Sit To/From Stand Outcome: Progressing Flowsheets (Taken 08/13/2024 1407) Patient will transfer sit to/from stand:  with minimal assist  with moderate assist Goal: Pt Will Transfer Bed To Chair/Chair To Bed Outcome: Progressing Flowsheets (Taken 08/13/2024 1407) Pt will Transfer Bed to Chair/Chair to Bed:  with min assist  with mod assist Goal: Pt Will Ambulate Outcome: Progressing Flowsheets (Taken 08/13/2024 1407) Pt will Ambulate:  25 feet  with moderate assist  with minimal assist  with rolling walker   2:08 PM, 08/13/24 Lynwood Music, MPT Physical Therapist with [facility] 336 [phone] office [phone] mobile phone

## 2024-08-14 MED ORDER — ACETAMINOPHEN 500 MG PO TABS: 1000.0000 mg | ORAL_TABLET | Freq: Three times a day (TID) | ORAL | Status: AC

## 2024-08-14 MED ORDER — ORAL CARE MOUTH RINSE: 15.0000 mL | OROMUCOSAL | Status: DC | PRN

## 2024-08-14 MED ORDER — IPRATROPIUM-ALBUTEROL 0.5-2.5 (3) MG/3ML IN SOLN
3.0000 mL | Freq: Three times a day (TID) | RESPIRATORY_TRACT | Status: DC
  Administered 2024-08-14: 3 mL via RESPIRATORY_TRACT
  Filled 2024-08-14: qty 3

## 2024-08-14 MED ORDER — OXYCODONE HCL 5 MG PO CAPS: 5.0000 mg | ORAL_CAPSULE | Freq: Four times a day (QID) | ORAL | 0 refills | Status: DC | PRN

## 2024-08-14 MED ORDER — METHOCARBAMOL 500 MG PO TABS: 500.0000 mg | ORAL_TABLET | Freq: Four times a day (QID) | ORAL | Status: AC | PRN

## 2024-08-14 MED ORDER — FLUTICASONE-UMECLIDIN-VILANT 100-62.5-25 MCG/ACT IN AEPB: 1.0000 | INHALATION_SPRAY | Freq: Every day | RESPIRATORY_TRACT | Status: DC

## 2024-08-14 MED ORDER — CLONAZEPAM 0.5 MG PO TABS
0.5000 mg | ORAL_TABLET | Freq: Two times a day (BID) | ORAL | Status: DC | PRN
  Administered 2024-08-14: 0.5 mg via ORAL
  Filled 2024-08-14: qty 1

## 2024-08-14 MED ORDER — CLONAZEPAM 0.5 MG PO TABS: 0.5000 mg | ORAL_TABLET | Freq: Two times a day (BID) | ORAL | 0 refills | Status: AC | PRN

## 2024-08-14 MED ORDER — SODIUM CHLORIDE 0.9 % IV BOLUS
1000.0000 mL | Freq: Once | INTRAVENOUS | Status: AC
  Administered 2024-08-14: 1000 mL via INTRAVENOUS

## 2024-08-14 MED ORDER — AMIODARONE HCL 200 MG PO TABS: 200.0000 mg | ORAL_TABLET | Freq: Every day | ORAL | Status: AC

## 2024-08-14 NOTE — TOC Progression Note (Signed)
 Transition of Care [facility]) - Progression Note    Patient Details  Name: Nancy Avila MRN: [id number] Date of Birth: 07-May-1941  Transition of Care [facility]) CM/SW Contact  Lucie Lunger, [place] Phone Number: 08/14/2024, 9:57 AM  Clinical Narrative:    CSW spoke with Tammy at [facility], pts insurance auth for SNF is pending at this time. [facility] will have bed open for pt tomorrow. TOC to follow.   Expected Discharge Plan: Skilled Nursing Facility Barriers to Discharge: Continued Medical Work up               Expected Discharge Plan and Services In-house Referral: Clinical Social Work Discharge Planning Services: CM Consult Post Acute Care Choice: Skilled Nursing Facility Living arrangements for the past 2 months: Single Family Home                                       Social Drivers of Health (SDOH) Interventions SDOH Screenings   Food Insecurity: No Food Insecurity (08/10/2024)  Housing: Low Risk  (08/10/2024)  Transportation Needs: No Transportation Needs (08/10/2024)  Utilities: Not At Risk (08/10/2024)  Social Connections: Moderately Integrated (08/10/2024)  Tobacco Use: Medium Risk (08/10/2024)    Readmission Risk Interventions    08/13/2024   11:28 AM 08/11/2024    9:51 AM  Readmission Risk Prevention Plan  Medication Screening  Complete  Transportation Screening Complete Complete  Home Care Screening Complete   Medication Review (RN CM) Complete

## 2024-08-14 NOTE — Progress Notes (Signed)
 Physical Therapy Treatment Patient Details Name: Nancy Avila MRN: [id number] DOB: 1940/09/30 Today's Date: 08/14/2024   History of Present Illness Nancy Avila is a 83 y.o. female with medical history significant of COPD, atrial fibrillation on anticoagulation, hyperglycemia.  Patient brought in via EMS due to fall at home after trying to move the chair.  This was a mechanical fall.  She did hit her head and her left hip.  She had immediate pain and was unable to move.  Here, xray shows left hip fracture.  She is on Eliquis  for atrial fibrillation and took her last dose this morning. Status post HEMIARTHROPLASTY (BIPOLAR) HIP, lateral  APPROACH FOR FRACTURE (Left).    PT Comments  Pt supine in bed, friendly and willing to participate with therapy today, family present in room.  Presents with increased ease with bed mobility and transfer training with min A for safety, required min cueing for hand and foot placement to increase ease with sit to stands and min A required with Lt LE upon return to bed.  Pt able to ambulate 18 ft with stable gait, step to pattern with RW.  No LOB through session.  EOS pt requested to return to bed, stated she sat up earlier and fatigued following gait.    If plan is discharge home, recommend the following:     Can travel by private vehicle        Equipment Recommendations       Recommendations for Other Services       Precautions / Restrictions       Mobility  Bed Mobility Overal bed mobility: Modified Independent Bed Mobility: Supine to Sit     Supine to sit: Min assist     General bed mobility comments: Mod I getting out of bed, increased time, labored movements; required min A returning Lt LE to bed    Transfers Overall transfer level: Modified independent Equipment used: Rolling walker (2 wheels) Transfers: Sit to/from Stand Sit to Stand: Min assist           General transfer comment: Cueing for hand and foot placement prior  standing and sitting    Ambulation/Gait Ambulation/Gait assistance: Min assist Gait Distance (Feet): 18 Feet Assistive device: Rolling walker (2 wheels) Gait Pattern/deviations: Decreased step length - right, Decreased step length - left, Decreased stride length, Decreased stance time - left, Antalgic Gait velocity: slow     General Gait Details: Step to pattern with RW, limited Lt LE weight bearing.  Good stable mechanics iwht gait and standing at sink following restroom break   Comptroller Bed    Modified Rankin (Stroke Patients Only)       Balance                                            Communication    Cognition Arousal: Alert Behavior During Therapy: WFL for tasks assessed/performed   PT - Cognitive impairments: No apparent impairments                                Cueing    Exercises      General Comments        Pertinent Vitals/Pain Pain Assessment  Pain Assessment: 0-10 Pain Score: 6  Pain Location: L hip with weight bearing Pain Descriptors / Indicators: Aching Pain Intervention(s): Monitored during session, Limited activity within patient's tolerance, Repositioned    Home Living                          Prior Function            PT Goals (current goals can now be found in the care plan section)      Frequency           PT Plan      Co-evaluation              AM-PAC PT 6 Clicks Mobility   Outcome Measure  Help needed turning from your back to your side while in a flat bed without using bedrails?: A Little Help needed moving from lying on your back to sitting on the side of a flat bed without using bedrails?: A Little Help needed moving to and from a bed to a chair (including a wheelchair)?: A Little Help needed standing up from a chair using your arms (e.g., wheelchair or bedside chair)?: A Little Help needed to walk in hospital  room?: A Little Help needed climbing 3-5 steps with a railing? : A Lot 6 Click Score: 17    End of Session   Activity Tolerance: Patient tolerated treatment well;Patient limited by fatigue;Patient limited by pain Patient left: in bed;with call bell/phone within reach;with family/visitor present Nurse Communication: Mobility status PT Visit Diagnosis: Unsteadiness on feet (R26.81);Other abnormalities of gait and mobility (R26.89);Muscle weakness (generalized) (M62.81)     Time: 8961-8889 PT Time Calculation (min) (ACUTE ONLY): 32 min  Charges:    $Therapeutic Activity: 23-37 mins PT General Charges $$ ACUTE PT VISIT: 1 Visit                     Augustin Mclean, LPTA/CLT; CBIS [phone]  Mclean Augustin Amble 08/14/2024, 12:07 PM

## 2024-08-14 NOTE — Plan of Care (Signed)

## 2024-08-14 NOTE — Discharge Summary (Signed)
 Physician Discharge Summary   Patient: Nancy Avila MRN: [id number] DOB: 06-22-1941  Admit date:     08/10/2024  Discharge date: 08/15/2024  Discharge Physician: Alm Lorena Benham   PCP: Debrah Josette ORN., PA-C   Recommendations at discharge:   Please follow up with primary care provider within 1-2 weeks  Please repeat BMP and CBC in one week  Please maintain patient on 2L [place] and wean oxygen as tolerated for saturation >92%  Discharge Diagnoses: Principal Problem:   Closed left hip fracture (HCC) Active Problems:   Persistent atrial fibrillation (HCC)   Hypercoagulable state due to persistent atrial fibrillation (HCC)   COPD (chronic obstructive pulmonary disease) (HCC)  Resolved Problems:   * No resolved hospital problems. *  Hospital Course: 83 year old female with a history of persistent atrial fibrillation, COPD, hyperlipidemia, anxiety presenting with a mechanical fall.  The patient was moving a chair/furniture around her house when she sustained a fall onto her left side.  She had left hip pain and difficulty getting up. At baseline, the patient is fully independent with her ADLs.  She still drives.  She lives by herself. The patient had been in her usual state of health without any complaints. Patient denies fevers, chills, headache, chest pain, dyspnea, nausea, vomiting, diarrhea, abdominal pain, dysuria, hematuria, hematochezia, and melena.  In the ED, the patient was afebrile hemodynamically stable with oxygen saturation 94% 2 L.  Apparently she had some oxygen desaturation after she was given intravenous opioids. Initial labs showed WBC 10.8, hemoglobin 13.4, plates 857.  Sodium 131, potassium 4.3, bicarbonate 26, serum creatinine 0.80.  AST 44, ALT 49, alk phosphatase 60, total bilirubin 0.7.  Orthopedic surgery, Dr. Margrette was consulted.  Patient underwent right hip arthroplasty on 08/12/2024.  She had no immediate postoperative complications.  PT was consulted.  They  recommended skilled nursing facility with which the patient and family agreed.  The patient did have some hyponatremia which is felt to be secondary to poor solute intake and volume depletion.  She was started on IV NS with improvement. Patient was noted to have new 2 L oxygen.  She has a prior history of tobacco use.  Chest x-ray showed chronic interstitial markings.  Suspect patient has underlying COPD and has required oxygen.  She will go to a skilled nursing facility with 2 L nasal cannula.  Wean oxygen as tolerated for saturation of 92%.  Assessment and Plan:   Left proximal femur fracture -Orthopedic surgery consulted - 11/16--left hip arthroplasty - PT eval>>SNF - acetaminophen 1 g TID - prn oxycodone   Persistent atrial fibrillation - Currently rate controlled - Last dose apixaban  08/10/2024 @ 8 AM - Continue amiodarone  - Continue metoprolol  succinate - Keep K>4 - 11/17 restarted apixaban    COPD - Currently stable without wheezing - Personally reviewed chest x-ray--chronic interstitial markings - stable on 2L - continue Trelegy - suspect pt was hypoxia PTA  -wean oxygen for saturation > 92%   Hyponatremia -due to volume depletion and poor solute intake -improving with IVF    Consultants: ortho Procedures performed: right hemiarthroplasty  Disposition: Skilled nursing facility Diet recommendation:  Regular diet DISCHARGE MEDICATION: Allergies as of 08/14/2024       Reactions   Codeine Other (See Comments)   Gave pt nightmares   Nyquil Cough Dm + Congestion [phenylephrine-doxylamine-dm] Other (See Comments)   Made pt feel crazy        Medication List     STOP taking these medications  clonazePAM 0.5 MG tablet Commonly known as: KLONOPIN       TAKE these medications    acetaminophen 500 MG tablet Commonly known as: TYLENOL Take 2 tablets (1,000 mg total) by mouth every 8 (eight) hours. What changed:  when to take this reasons to take  this   albuterol 108 (90 Base) MCG/ACT inhaler Commonly known as: VENTOLIN HFA Inhale 1-2 puffs into the lungs every 6 (six) hours as needed for wheezing or shortness of breath.   amiodarone  200 MG tablet Commonly known as: PACERONE  Take 1 tablet (200 mg total) by mouth daily. What changed: See the new instructions.   apixaban  5 MG Tabs tablet Commonly known as: ELIQUIS  Take 1 tablet (5 mg total) by mouth 2 (two) times daily.   chlorhexidine 4 % external liquid Commonly known as: HIBICLENS Apply 15 mLs (1 Application total) topically as directed for 30 doses. Use as directed daily for 5 days every other week for 6 weeks.   fluticasone 50 MCG/ACT nasal spray Commonly known as: FLONASE Place 2 sprays into both nostrils daily as needed for allergies.   GINSENG COMPLEX PO Take 1 capsule by mouth daily.   HAIR SKIN NAILS PO Take 1 capsule by mouth daily.   Magnesium 300 MG Caps Take 300 mg by mouth every evening.   methocarbamol 500 MG tablet Commonly known as: ROBAXIN Take 1 tablet (500 mg total) by mouth every 6 (six) hours as needed for muscle spasms.   metoprolol  succinate 25 MG 24 hr tablet Commonly known as: Toprol  XL Take 1 tablet (25 mg total) by mouth daily. What changed: when to take this   multivitamin with minerals Tabs tablet Take 1 tablet by mouth daily.   mupirocin ointment 2 % Commonly known as: BACTROBAN Place 1 Application into the nose 2 (two) times daily for 60 doses. Use as directed 2 times daily for 5 days every other week for 6 weeks. What changed:  how to take this when to take this additional instructions   OMEGA 3 PO Take 2 capsules by mouth daily.   OVER THE COUNTER MEDICATION Take 2 tablets by mouth daily. Memory and Brain supplement   oxycodone 5 MG capsule Commonly known as: OXY-IR Take 1 capsule (5 mg total) by mouth every 6 (six) hours as needed.   PROBIOTIC PO Take 1 capsule by mouth daily.   Trelegy Ellipta 100-62.5-25  MCG/ACT Aepb Generic drug: Fluticasone-Umeclidin-Vilant Inhale 1 puff into the lungs daily.   TURMERIC PO Take 400 mg by mouth daily.   vitamin B-12 500 MCG tablet Commonly known as: CYANOCOBALAMIN Take 500 mcg by mouth daily.   vitamin C 1000 MG tablet Take 1,000 mg by mouth daily.   VITAMIN D-VITAMIN K PO Take 1 capsule by mouth daily.   Vitamin D3 75 MCG (3000 UT) Tabs Take 3,000 Units by mouth daily.   Zinc 30 MG Caps Take 30 mg by mouth daily.        Contact information for after-discharge care     Destination     [facility] .   Service: Skilled Nursing Contact information: [address] [place]   [phone]                    Discharge Exam: Filed Weights   08/10/24 1500 08/10/24 2032  Weight: 83.9 kg 84.7 kg   HEENT:  [place]/AT, No thrush, no icterus CV:  RRR, no rub, no S3, no S4  Lung:  bibasilar crackles.  No wheeze Abd:  soft/+BS, NT Ext:  + R hip edema, no lymphangitis, no synovitis, no rash   Condition at discharge: stable  The results of significant diagnostics from this hospitalization (including imaging, microbiology, ancillary and laboratory) are listed below for reference.   Imaging Studies: DG HIP UNILAT WITH PELVIS 2-3 VIEWS LEFT Result Date: 08/12/2024 CLINICAL DATA:  Status post left hip surgery. EXAM: DG HIP (WITH OR WITHOUT PELVIS) 2-3V*L* COMPARISON:  Radiographs 08/10/2024. FINDINGS: Patient has undergone interval left hip bipolar hemiarthroplasty. The hardware is intact and appears well positioned. No new fracture or dislocation identified mild right hip degenerative changes. Lower lumbar spondylosis noted. There is a small amount of gas within the soft tissues surrounding the left hip. Skin staples are in place. IMPRESSION: Interval left hip bipolar hemiarthroplasty without demonstrated complication. Electronically Signed   By: Elsie Perone M.D.   On:  08/12/2024 13:46   ECHOCARDIOGRAM COMPLETE Result Date: 08/11/2024    ECHOCARDIOGRAM REPORT   Patient Name:   JELISSA ESPIRITU Date of Exam: 08/11/2024 Medical Rec #:  [id number]        Height:       68.0 in Accession #:    [id number]       Weight:       186.7 lb Date of Birth:  May 08, 1941       BSA:          1.985 m Patient Age:    82 years         BP:           125/54 mmHg Patient Gender: F                HR:           59 bpm. Exam Location:  Inpatient Procedure: 2D Echo, Cardiac Doppler and Color Doppler (Both Spectral and Color            Flow Doppler were utilized during procedure). Indications:    Z01.818 Encounter for other preprocedural examination  History:        Patient has prior history of Echocardiogram examinations, most                 recent 10/17/2023. Abnormal ECG, COPD; Arrythmias:Atrial                 Fibrillation.  Sonographer:    Ellouise Mose RDCS Referring Phys: [phone] JACOB J STINSON  Sonographer Comments: Technically difficult study due to poor echo windows. Patient supine with acute broken leg on right side of bed. Very difficult to reach patient. Could not move without pain. Rib artifact due to position. IMPRESSIONS  1. Left ventricular ejection fraction, by estimation, is 65 to 70%. The left ventricle has normal function. The left ventricle has no regional wall motion abnormalities. There is mild left ventricular hypertrophy. Left ventricular diastolic parameters are consistent with Grade I diastolic dysfunction (impaired relaxation).  2. Right ventricular systolic function is normal. The right ventricular size is mildly enlarged. Tricuspid regurgitation signal is inadequate for assessing PA pressure.  3. The mitral valve is normal in structure. Trivial mitral valve regurgitation. No evidence of mitral stenosis.  4. The aortic valve is tricuspid. Aortic valve regurgitation is trivial. No aortic stenosis is present.  5. Aortic dilatation noted. There is dilatation of the ascending aorta,  measuring 40 mm.  6. The inferior vena cava is normal in size with greater than 50% respiratory variability, suggesting right atrial  pressure of 3 mmHg. FINDINGS  Left Ventricle: Left ventricular ejection fraction, by estimation, is 65 to 70%. The left ventricle has normal function. The left ventricle has no regional wall motion abnormalities. The left ventricular internal cavity size was normal in size. There is  mild left ventricular hypertrophy. Left ventricular diastolic parameters are consistent with Grade I diastolic dysfunction (impaired relaxation). Right Ventricle: The right ventricular size is mildly enlarged. No increase in right ventricular wall thickness. Right ventricular systolic function is normal. Tricuspid regurgitation signal is inadequate for assessing PA pressure. Left Atrium: Left atrial size was normal in size. Right Atrium: Right atrial size was normal in size. Pericardium: There is no evidence of pericardial effusion. Mitral Valve: The mitral valve is normal in structure. Trivial mitral valve regurgitation. No evidence of mitral valve stenosis. Tricuspid Valve: The tricuspid valve is normal in structure. Tricuspid valve regurgitation is trivial. No evidence of tricuspid stenosis. Aortic Valve: The aortic valve is tricuspid. Aortic valve regurgitation is trivial. No aortic stenosis is present. Pulmonic Valve: The pulmonic valve was not well visualized. Pulmonic valve regurgitation is trivial. No evidence of pulmonic stenosis. Aorta: Aortic dilatation noted and the aortic root is normal in size and structure. There is dilatation of the ascending aorta, measuring 40 mm. Venous: The inferior vena cava is normal in size with greater than 50% respiratory variability, suggesting right atrial pressure of 3 mmHg. IAS/Shunts: No atrial level shunt detected by color flow Doppler.  LEFT VENTRICLE PLAX 2D LVIDd:         4.60 cm     Diastology LVIDs:         2.40 cm     LV e' medial:    6.09 cm/s LV PW:          1.00 cm     LV E/e' medial:  14.8 LV IVS:        1.00 cm     LV e' lateral:   7.40 cm/s LVOT diam:     2.00 cm     LV E/e' lateral: 12.1 LV SV:         74 LV SV Index:   37 LVOT Area:     3.14 cm LV IVRT:       95 msec  LV Volumes (MOD) LV vol d, MOD A2C: 94.1 ml LV vol d, MOD A4C: 75.8 ml LV vol s, MOD A2C: 26.3 ml LV vol s, MOD A4C: 25.9 ml LV SV MOD A2C:     67.8 ml LV SV MOD A4C:     75.8 ml LV SV MOD BP:      60.9 ml RIGHT VENTRICLE             IVC RV S prime:     13.50 cm/s  IVC diam: 1.20 cm TAPSE (M-mode): 3.2 cm LEFT ATRIUM           Index        RIGHT ATRIUM           Index LA diam:      3.20 cm 1.61 cm/m   RA Area:     16.00 cm LA Vol (A2C): 38.2 ml 19.24 ml/m  RA Volume:   35.30 ml  17.78 ml/m LA Vol (A4C): 45.7 ml 23.02 ml/m  AORTIC VALVE LVOT Vmax:   106.00 cm/s LVOT Vmean:  68.700 cm/s LVOT VTI:    0.234 m  AORTA Ao Root diam: 3.60 cm Ao Asc diam:  4.00 cm MITRAL VALVE MV  Area (PHT): 2.34 cm     SHUNTS MV Decel Time: 324 msec     Systemic VTI:  0.23 m MV E velocity: 89.85 cm/s   Systemic Diam: 2.00 cm MV A velocity: 119.00 cm/s MV E/A ratio:  0.76 Lonni Nanas MD Electronically signed by Lonni Nanas MD Signature Date/Time: 08/11/2024/5:10:53 PM    Final    CT Cervical Spine Wo Contrast Result Date: 08/10/2024 EXAM: CT CERVICAL SPINE WITHOUT CONTRAST 08/10/2024 04:51:00 PM TECHNIQUE: CT of the cervical spine was performed without the administration of intravenous contrast. Multiplanar reformatted images are provided for review. Automated exposure control, iterative reconstruction, and/or weight based adjustment of the mA/kV was utilized to reduce the radiation dose to as low as reasonably achievable. COMPARISON: None available. CLINICAL HISTORY: Neck trauma (Age >= 65y) FINDINGS: CERVICAL SPINE: BONES AND ALIGNMENT: Straightening of the normal cervical lordosis. Degenerative changes of the dens. No high grade osseous spinal canal stenosis. No acute fracture or  traumatic malalignment. DEGENERATIVE CHANGES: Mild disc space narrowing at C6-C7. Facet arthrosis and uncovertebral hypertrophy most pronounced at C5-C6 and C6-C7 with associated foraminal stenosis at these levels most pronounced on the left at C5-C6. SOFT TISSUES: Atherosclerosis at the carotid bifurcations. Heterogeneous appearance of the thyroid  with multiple subcentimeter nodules. Nodular soft tissue at the right lung apex along the pleura seen on series 8 image 73 and on series 4 image 20 without definite correlate on CTA from 2020. Recommend nonemergent CT of the chest for further evaluation. No prevertebral soft tissue swelling. IMPRESSION: 1. No acute abnormality of the cervical spine related to the reported neck trauma. 2. Degenerative changes as above. 3. Right apical pleural-based nodular soft tissue without definite 2020 CTA correlate. Recommend non-emergent chest CT for further evaluation. Electronically signed by: Donnice Mania MD 08/10/2024 05:07 PM EST RP Workstation: [id number]   CT Head Wo Contrast Result Date: 08/10/2024 EXAM: CT HEAD WITHOUT CONTRAST 08/10/2024 04:51:00 PM TECHNIQUE: CT of the head was performed without the administration of intravenous contrast. Automated exposure control, iterative reconstruction, and/or weight based adjustment of the mA/kV was utilized to reduce the radiation dose to as low as reasonably achievable. COMPARISON: 03/14/2008. CLINICAL HISTORY: Head trauma, minor (Age >= 65y). FINDINGS: BRAIN AND VENTRICLES: No acute hemorrhage. No evidence of acute infarct. No hydrocephalus. No extra-axial collection. No mass effect or midline shift. Intracranial atherosclerosis involving the carotid siphons and intracranial right vertebral artery. ORBITS: Bilateral lens replacement. SINUSES: No acute abnormality. SOFT TISSUES AND SKULL: No acute soft tissue abnormality. No skull fracture. IMPRESSION: 1. No acute intracranial abnormality. Electronically signed by: Donnice Mania  MD 08/10/2024 04:58 PM EST RP Workstation: [id number]   DG Chest Port 1 View Result Date: 08/10/2024 CLINICAL DATA:  Status post fall. EXAM: PORTABLE CHEST 1 VIEW COMPARISON:  August 02, 2013 FINDINGS: The heart size and mediastinal contours are within normal limits. Mild, diffuse, chronic appearing increased interstitial lung markings are seen. No acute infiltrate, pleural effusion or pneumothorax is identified. Multilevel degenerative changes are present throughout the thoracic spine. IMPRESSION: Chronic appearing increased interstitial lung markings without acute or active cardiopulmonary disease. Electronically Signed   By: Suzen Dials M.D.   On: 08/10/2024 16:07   DG Hip Unilat W or Wo Pelvis 2-3 Views Left Result Date: 08/10/2024 CLINICAL DATA:  Status post fall. EXAM: DG HIP (WITH OR WITHOUT PELVIS) 2-3V LEFT COMPARISON:  None Available. FINDINGS: An acute fracture deformity is seen extending through the neck of the proximal left femur with approximately 1/2 shaft width dorsal  displacement of the distal fracture site. There is no evidence of dislocation. Mild degenerative changes are seen in the form of joint space narrowing and acetabular sclerosis. IMPRESSION: Acute fracture of the proximal left femur. Electronically Signed   By: Suzen Dials M.D.   On: 08/10/2024 16:05    Microbiology: Results for orders placed or performed during the hospital encounter of 08/10/24  Surgical PCR screen     Status: Abnormal   Collection Time: 08/11/24  6:04 PM   Specimen: Nasal Mucosa; Nasal Swab  Result Value Ref Range Status   MRSA, PCR NONE DETECTED (A) NEGATIVE Final   Staphylococcus aureus DETECTED (A) NEGATIVE Final    Comment: (NOTE) The Xpert SA Assay (FDA approved for NASAL specimens in patients 10 years of age and older), is one component of a comprehensive surveillance program. It is not intended to diagnose infection nor to guide or monitor treatment. Performed at [facility], [address] [place], [place] [address]     Labs: CBC: Recent Labs  Lab 08/10/24 1545 08/11/24 0427 08/12/24 0351 08/13/24 0441  WBC 10.6* 8.8 7.4 7.1  NEUTROABS 8.4*  --   --   --   HGB 13.4 12.9 12.6 11.5*  HCT 39.1 37.5 36.4 33.4*  MCV 92.2 91.5 91.0 91.0  PLT 142* 125* 106* 96*   Basic Metabolic Panel: Recent Labs  Lab 08/10/24 1545 08/11/24 0427 08/12/24 0351 08/13/24 0441  NA 131* 130* 128* 131*  K 4.3 4.5 3.7 3.9  CL 96* 95* 94* 98  CO2 26 27 24 26   GLUCOSE 113* 120* 86 119*  BUN 16 13 9 9   CREATININE 0.80 0.71 0.59 0.59  CALCIUM 9.2 8.7* 8.2* 8.0*  MG  --   --  2.2 2.0   Liver Function Tests: Recent Labs  Lab 08/10/24 1545  AST 44*  ALT 49*  ALKPHOS 60  BILITOT 0.7  PROT 6.5  ALBUMIN 4.4   CBG: No results for input(s): GLUCAP in the last 168 hours.  Discharge time spent: greater than 30 minutes.  Signed: Alm Schneider, MD [facility] 08/14/2024

## 2024-08-15 LAB — CBC
HCT: 32.6 % — ABNORMAL LOW (ref 36.0–46.0)
Hemoglobin: 11.1 g/dL — ABNORMAL LOW (ref 12.0–15.0)
MCH: 31 pg (ref 26.0–34.0)
MCHC: 34 g/dL (ref 30.0–36.0)
MCV: 91.1 fL (ref 80.0–100.0)
Platelets: 104 K/uL — ABNORMAL LOW (ref 150–400)
RBC: 3.58 MIL/uL — ABNORMAL LOW (ref 3.87–5.11)
RDW: 13.6 % (ref 11.5–15.5)
WBC: 7.2 K/uL (ref 4.0–10.5)
nRBC: 0 % (ref 0.0–0.2)

## 2024-08-15 LAB — BASIC METABOLIC PANEL WITH GFR
Anion gap: 10 (ref 5–15)
BUN: 9 mg/dL (ref 8–23)
CO2: 25 mmol/L (ref 22–32)
Calcium: 8.2 mg/dL — ABNORMAL LOW (ref 8.9–10.3)
Chloride: 100 mmol/L (ref 98–111)
Creatinine, Ser: 0.52 mg/dL (ref 0.44–1.00)
GFR, Estimated: >60 mL/min (ref >60)
Glucose, Bld: 106 mg/dL — ABNORMAL HIGH (ref 70–99)
Potassium: 3.3 mmol/L — ABNORMAL LOW (ref 3.5–5.1)
Sodium: 135 mmol/L (ref 135–145)

## 2024-08-15 MED ORDER — POTASSIUM CHLORIDE CRYS ER 20 MEQ PO TBCR
40.0000 meq | EXTENDED_RELEASE_TABLET | Freq: Once | ORAL | Status: AC
  Administered 2024-08-15: 40 meq via ORAL
  Filled 2024-08-15: qty 2

## 2024-08-15 NOTE — Progress Notes (Signed)
 OT Cancellation Note  Patient Details Name: DIASHA CASTLEMAN MRN: [id number] DOB: 04/18/1941   Cancelled Treatment:    Reason Eval/Treat Not Completed: Fatigue/lethargy limiting ability to participate;Pain limiting ability to participate. Pt reported she had just worked with physical therapy and preferred not to have an OT session at this time. Will attempt to see pt later if time allows and pt is agreeable.   Gabrelle Roca OT, MOT   Jayson Person 08/15/2024, 10:51 AM

## 2024-08-15 NOTE — Discharge Summary (Addendum)
 Physician Discharge Summary   Patient: Nancy Avila MRN: [id number] DOB: 11-29-1940  Admit date:     08/10/2024  Discharge date: 08/15/2024  Discharge Physician: Ivan Vangie Pike   PCP: Debrah Josette ORN., PA-C   Recommendations at discharge:   Please follow up with primary care provider within 1-2 weeks  Please repeat BMP and CBC in one week with attention to potassium  Please maintain patient on 2L [place] and wean oxygen as tolerated for saturation >92%  Discharge Diagnoses: Principal Problem:   Closed left hip fracture (HCC) Active Problems:   Persistent atrial fibrillation (HCC)   Hypercoagulable state due to persistent atrial fibrillation (HCC)   COPD (chronic obstructive pulmonary disease) (HCC)  Resolved Problems:   * No resolved hospital problems. *  Hospital Course: 83 year old female with a history of persistent atrial fibrillation, COPD, hyperlipidemia, anxiety presenting with a mechanical fall.  The patient was moving a chair/furniture around her house when she sustained a fall onto her left side.  She had left hip pain and difficulty getting up. At baseline, the patient is fully independent with her ADLs.  She still drives.  She lives by herself. The patient had been in her usual state of health without any complaints. Patient denies fevers, chills, headache, chest pain, dyspnea, nausea, vomiting, diarrhea, abdominal pain, dysuria, hematuria, hematochezia, and melena.  In the ED, the patient was afebrile hemodynamically stable with oxygen saturation 94% 2 L.  Apparently she had some oxygen desaturation after she was given intravenous opioids. Initial labs showed WBC 10.8, hemoglobin 13.4, plates 857.  Sodium 131, potassium 4.3, bicarbonate 26, serum creatinine 0.80.  AST 44, ALT 49, alk phosphatase 60, total bilirubin 0.7.  Orthopedic surgery, Dr. Margrette was consulted.  Patient underwent right hip arthroplasty on 08/12/2024.  She had no immediate postoperative  complications.  PT was consulted.  They recommended skilled nursing facility with which the patient and family agreed.  The patient did have some hyponatremia which is felt to be secondary to poor solute intake and volume depletion.  She was started on IV NS with improvement. Patient was noted to have new 2 L oxygen.  She has a prior history of tobacco use.  Chest x-ray showed chronic interstitial markings.  Suspect patient has underlying COPD and has required oxygen.  She will go to a skilled nursing facility with 2 L nasal cannula.  Wean oxygen as tolerated for saturation of 92%.  Assessment and Plan:  Hypokalemia  Potassium 3.3 today, repleted with K-Dur 40 mill equivalents p.o. x 1 Follow-up potassium at next physician visit at SNF  Left proximal femur fracture -Orthopedic surgery consulted - 11/16--left hip arthroplasty - PT eval>>SNF - acetaminophen  1 g TID - prn oxycodone    Persistent atrial fibrillation - Currently rate controlled - Last dose apixaban  08/10/2024 @ 8 AM - Continue amiodarone  - Continue metoprolol  succinate - Keep K>4 - 11/17 restarted apixaban    COPD - Currently stable without wheezing - Personally reviewed chest x-ray--chronic interstitial markings - stable on 2L - continue Trelegy - suspect pt was hypoxia PTA  -wean oxygen for saturation > 92%   Hyponatremia -due to volume depletion and poor solute intake -improving with IVF    Consultants: ortho Procedures performed: right hemiarthroplasty  Disposition: Skilled nursing facility Diet recommendation:  Regular diet DISCHARGE MEDICATION: Allergies as of 08/15/2024       Reactions   Codeine Other (See Comments)   Gave pt nightmares   Nyquil Cough Dm + Congestion [phenylephrine-doxylamine-dm] Other (See  Comments)   Made pt feel crazy        Medication List     TAKE these medications    acetaminophen 500 MG tablet Commonly known as: TYLENOL Take 2 tablets (1,000 mg total) by mouth  every 8 (eight) hours. What changed:  when to take this reasons to take this   albuterol 108 (90 Base) MCG/ACT inhaler Commonly known as: VENTOLIN HFA Inhale 1-2 puffs into the lungs every 6 (six) hours as needed for wheezing or shortness of breath.   amiodarone  200 MG tablet Commonly known as: PACERONE  Take 1 tablet (200 mg total) by mouth daily. What changed: See the new instructions.   apixaban  5 MG Tabs tablet Commonly known as: ELIQUIS  Take 1 tablet (5 mg total) by mouth 2 (two) times daily.   chlorhexidine 4 % external liquid Commonly known as: HIBICLENS Apply 15 mLs (1 Application total) topically as directed for 30 doses. Use as directed daily for 5 days every other week for 6 weeks.   clonazePAM 0.5 MG tablet Commonly known as: KLONOPIN Take 1 tablet (0.5 mg total) by mouth 2 (two) times daily as needed (anxiety). What changed:  how much to take when to take this reasons to take this   fluticasone 50 MCG/ACT nasal spray Commonly known as: FLONASE Place 2 sprays into both nostrils daily as needed for allergies.   GINSENG COMPLEX PO Take 1 capsule by mouth daily.   HAIR SKIN NAILS PO Take 1 capsule by mouth daily.   Magnesium 300 MG Caps Take 300 mg by mouth every evening.   methocarbamol 500 MG tablet Commonly known as: ROBAXIN Take 1 tablet (500 mg total) by mouth every 6 (six) hours as needed for muscle spasms.   metoprolol  succinate 25 MG 24 hr tablet Commonly known as: Toprol  XL Take 1 tablet (25 mg total) by mouth daily. What changed: when to take this   multivitamin with minerals Tabs tablet Take 1 tablet by mouth daily.   mupirocin ointment 2 % Commonly known as: BACTROBAN Place 1 Application into the nose 2 (two) times daily for 60 doses. Use as directed 2 times daily for 5 days every other week for 6 weeks. What changed:  how to take this when to take this additional instructions   OMEGA 3 PO Take 2 capsules by mouth daily.   OVER THE  COUNTER MEDICATION Take 2 tablets by mouth daily. Memory and Brain supplement   oxycodone 5 MG capsule Commonly known as: OXY-IR Take 1 capsule (5 mg total) by mouth every 6 (six) hours as needed.   PROBIOTIC PO Take 1 capsule by mouth daily.   Trelegy Ellipta 100-62.5-25 MCG/ACT Aepb Generic drug: Fluticasone-Umeclidin-Vilant Inhale 1 puff into the lungs daily.   TURMERIC PO Take 400 mg by mouth daily.   vitamin B-12 500 MCG tablet Commonly known as: CYANOCOBALAMIN Take 500 mcg by mouth daily.   vitamin C 1000 MG tablet Take 1,000 mg by mouth daily.   VITAMIN D-VITAMIN K PO Take 1 capsule by mouth daily.   Vitamin D3 75 MCG (3000 UT) Tabs Take 3,000 Units by mouth daily.   Zinc 30 MG Caps Take 30 mg by mouth daily.        Contact information for after-discharge care     Destination     [facility] .   Service: Skilled Nursing Contact information: [address] [place]  [phone]  Discharge Exam: Filed Weights   08/10/24 1500 08/10/24 2032  Weight: 83.9 kg 84.7 kg   HEENT:  [facility]/AT, No thrush, no icterus CV:  RRR, no rub, no S3, no S4 Lung:  bibasilar crackles.  No wheeze Abd:  soft/+BS, NT Ext:  + R hip edema, no lymphangitis, no synovitis, no rash   Condition at discharge: stable  The results of significant diagnostics from this hospitalization (including imaging, microbiology, ancillary and laboratory) are listed below for reference.   Imaging Studies: DG HIP UNILAT WITH PELVIS 2-3 VIEWS LEFT Result Date: 08/12/2024 CLINICAL DATA:  Status post left hip surgery. EXAM: DG HIP (WITH OR WITHOUT PELVIS) 2-3V*L* COMPARISON:  Radiographs 08/10/2024. FINDINGS: Patient has undergone interval left hip bipolar hemiarthroplasty. The hardware is intact and appears well positioned. No new fracture or dislocation identified mild right hip degenerative changes. Lower  lumbar spondylosis noted. There is a small amount of gas within the soft tissues surrounding the left hip. Skin staples are in place. IMPRESSION: Interval left hip bipolar hemiarthroplasty without demonstrated complication. Electronically Signed   By: Elsie Perone M.D.   On: 08/12/2024 13:46   ECHOCARDIOGRAM COMPLETE Result Date: 08/11/2024    ECHOCARDIOGRAM REPORT   Patient Name:   BRELAND ELDERS Date of Exam: 08/11/2024 Medical Rec #:  [id number]        Height:       68.0 in Accession #:    [id number]       Weight:       186.7 lb Date of Birth:  September 05, 1941       BSA:          1.985 m Patient Age:    82 years         BP:           125/54 mmHg Patient Gender: F                HR:           59 bpm. Exam Location:  Inpatient Procedure: 2D Echo, Cardiac Doppler and Color Doppler (Both Spectral and Color            Flow Doppler were utilized during procedure). Indications:    Z01.818 Encounter for other preprocedural examination  History:        Patient has prior history of Echocardiogram examinations, most                 recent 10/17/2023. Abnormal ECG, COPD; Arrythmias:Atrial                 Fibrillation.  Sonographer:    Ellouise Mose RDCS Referring Phys: [phone] JACOB J STINSON  Sonographer Comments: Technically difficult study due to poor echo windows. Patient supine with acute broken leg on right side of bed. Very difficult to reach patient. Could not move without pain. Rib artifact due to position. IMPRESSIONS  1. Left ventricular ejection fraction, by estimation, is 65 to 70%. The left ventricle has normal function. The left ventricle has no regional wall motion abnormalities. There is mild left ventricular hypertrophy. Left ventricular diastolic parameters are consistent with Grade I diastolic dysfunction (impaired relaxation).  2. Right ventricular systolic function is normal. The right ventricular size is mildly enlarged. Tricuspid regurgitation signal is inadequate for assessing PA pressure.  3. The mitral  valve is normal in structure. Trivial mitral valve regurgitation. No evidence of mitral stenosis.  4. The aortic valve is tricuspid. Aortic valve regurgitation is trivial. No aortic stenosis is present.  5. Aortic dilatation noted. There is dilatation of the ascending aorta, measuring 40 mm.  6. The inferior vena cava is normal in size with greater than 50% respiratory variability, suggesting right atrial pressure of 3 mmHg. FINDINGS  Left Ventricle: Left ventricular ejection fraction, by estimation, is 65 to 70%. The left ventricle has normal function. The left ventricle has no regional wall motion abnormalities. The left ventricular internal cavity size was normal in size. There is  mild left ventricular hypertrophy. Left ventricular diastolic parameters are consistent with Grade I diastolic dysfunction (impaired relaxation). Right Ventricle: The right ventricular size is mildly enlarged. No increase in right ventricular wall thickness. Right ventricular systolic function is normal. Tricuspid regurgitation signal is inadequate for assessing PA pressure. Left Atrium: Left atrial size was normal in size. Right Atrium: Right atrial size was normal in size. Pericardium: There is no evidence of pericardial effusion. Mitral Valve: The mitral valve is normal in structure. Trivial mitral valve regurgitation. No evidence of mitral valve stenosis. Tricuspid Valve: The tricuspid valve is normal in structure. Tricuspid valve regurgitation is trivial. No evidence of tricuspid stenosis. Aortic Valve: The aortic valve is tricuspid. Aortic valve regurgitation is trivial. No aortic stenosis is present. Pulmonic Valve: The pulmonic valve was not well visualized. Pulmonic valve regurgitation is trivial. No evidence of pulmonic stenosis. Aorta: Aortic dilatation noted and the aortic root is normal in size and structure. There is dilatation of the ascending aorta, measuring 40 mm. Venous: The inferior vena cava is normal in size with  greater than 50% respiratory variability, suggesting right atrial pressure of 3 mmHg. IAS/Shunts: No atrial level shunt detected by color flow Doppler.  LEFT VENTRICLE PLAX 2D LVIDd:         4.60 cm     Diastology LVIDs:         2.40 cm     LV e' medial:    6.09 cm/s LV PW:         1.00 cm     LV E/e' medial:  14.8 LV IVS:        1.00 cm     LV e' lateral:   7.40 cm/s LVOT diam:     2.00 cm     LV E/e' lateral: 12.1 LV SV:         74 LV SV Index:   37 LVOT Area:     3.14 cm LV IVRT:       95 msec  LV Volumes (MOD) LV vol d, MOD A2C: 94.1 ml LV vol d, MOD A4C: 75.8 ml LV vol s, MOD A2C: 26.3 ml LV vol s, MOD A4C: 25.9 ml LV SV MOD A2C:     67.8 ml LV SV MOD A4C:     75.8 ml LV SV MOD BP:      60.9 ml RIGHT VENTRICLE             IVC RV S prime:     13.50 cm/s  IVC diam: 1.20 cm TAPSE (M-mode): 3.2 cm LEFT ATRIUM           Index        RIGHT ATRIUM           Index LA diam:      3.20 cm 1.61 cm/m   RA Area:     16.00 cm LA Vol (A2C): 38.2 ml 19.24 ml/m  RA Volume:   35.30 ml  17.78 ml/m LA Vol (A4C): 45.7 ml 23.02 ml/m  AORTIC VALVE LVOT  Vmax:   106.00 cm/s LVOT Vmean:  68.700 cm/s LVOT VTI:    0.234 m  AORTA Ao Root diam: 3.60 cm Ao Asc diam:  4.00 cm MITRAL VALVE MV Area (PHT): 2.34 cm     SHUNTS MV Decel Time: 324 msec     Systemic VTI:  0.23 m MV E velocity: 89.85 cm/s   Systemic Diam: 2.00 cm MV A velocity: 119.00 cm/s MV E/A ratio:  0.76 Lonni Nanas MD Electronically signed by Lonni Nanas MD Signature Date/Time: 08/11/2024/5:10:53 PM    Final    CT Cervical Spine Wo Contrast Result Date: 08/10/2024 EXAM: CT CERVICAL SPINE WITHOUT CONTRAST 08/10/2024 04:51:00 PM TECHNIQUE: CT of the cervical spine was performed without the administration of intravenous contrast. Multiplanar reformatted images are provided for review. Automated exposure control, iterative reconstruction, and/or weight based adjustment of the mA/kV was utilized to reduce the radiation dose to as low as reasonably  achievable. COMPARISON: None available. CLINICAL HISTORY: Neck trauma (Age >= 65y) FINDINGS: CERVICAL SPINE: BONES AND ALIGNMENT: Straightening of the normal cervical lordosis. Degenerative changes of the dens. No high grade osseous spinal canal stenosis. No acute fracture or traumatic malalignment. DEGENERATIVE CHANGES: Mild disc space narrowing at C6-C7. Facet arthrosis and uncovertebral hypertrophy most pronounced at C5-C6 and C6-C7 with associated foraminal stenosis at these levels most pronounced on the left at C5-C6. SOFT TISSUES: Atherosclerosis at the carotid bifurcations. Heterogeneous appearance of the thyroid  with multiple subcentimeter nodules. Nodular soft tissue at the right lung apex along the pleura seen on series 8 image 73 and on series 4 image 20 without definite correlate on CTA from 2020. Recommend nonemergent CT of the chest for further evaluation. No prevertebral soft tissue swelling. IMPRESSION: 1. No acute abnormality of the cervical spine related to the reported neck trauma. 2. Degenerative changes as above. 3. Right apical pleural-based nodular soft tissue without definite 2020 CTA correlate. Recommend non-emergent chest CT for further evaluation. Electronically signed by: Donnice Mania MD 08/10/2024 05:07 PM EST RP Workstation: [id number]   CT Head Wo Contrast Result Date: 08/10/2024 EXAM: CT HEAD WITHOUT CONTRAST 08/10/2024 04:51:00 PM TECHNIQUE: CT of the head was performed without the administration of intravenous contrast. Automated exposure control, iterative reconstruction, and/or weight based adjustment of the mA/kV was utilized to reduce the radiation dose to as low as reasonably achievable. COMPARISON: 03/14/2008. CLINICAL HISTORY: Head trauma, minor (Age >= 65y). FINDINGS: BRAIN AND VENTRICLES: No acute hemorrhage. No evidence of acute infarct. No hydrocephalus. No extra-axial collection. No mass effect or midline shift. Intracranial atherosclerosis involving the carotid  siphons and intracranial right vertebral artery. ORBITS: Bilateral lens replacement. SINUSES: No acute abnormality. SOFT TISSUES AND SKULL: No acute soft tissue abnormality. No skull fracture. IMPRESSION: 1. No acute intracranial abnormality. Electronically signed by: Donnice Mania MD 08/10/2024 04:58 PM EST RP Workstation: [id number]   DG Chest Port 1 View Result Date: 08/10/2024 CLINICAL DATA:  Status post fall. EXAM: PORTABLE CHEST 1 VIEW COMPARISON:  August 02, 2013 FINDINGS: The heart size and mediastinal contours are within normal limits. Mild, diffuse, chronic appearing increased interstitial lung markings are seen. No acute infiltrate, pleural effusion or pneumothorax is identified. Multilevel degenerative changes are present throughout the thoracic spine. IMPRESSION: Chronic appearing increased interstitial lung markings without acute or active cardiopulmonary disease. Electronically Signed   By: Suzen Dials M.D.   On: 08/10/2024 16:07   DG Hip Unilat W or Wo Pelvis 2-3 Views Left Result Date: 08/10/2024 CLINICAL DATA:  Status post fall. EXAM: DG  HIP (WITH OR WITHOUT PELVIS) 2-3V LEFT COMPARISON:  None Available. FINDINGS: An acute fracture deformity is seen extending through the neck of the proximal left femur with approximately 1/2 shaft width dorsal displacement of the distal fracture site. There is no evidence of dislocation. Mild degenerative changes are seen in the form of joint space narrowing and acetabular sclerosis. IMPRESSION: Acute fracture of the proximal left femur. Electronically Signed   By: Suzen Dials M.D.   On: 08/10/2024 16:05    Microbiology: Results for orders placed or performed during the hospital encounter of 08/10/24  Surgical PCR screen     Status: Abnormal   Collection Time: 08/11/24  6:04 PM   Specimen: Nasal Mucosa; Nasal Swab  Result Value Ref Range Status   MRSA, PCR NONE DETECTED (A) NEGATIVE Final   Staphylococcus aureus DETECTED (A) NEGATIVE  Final    Comment: (NOTE) The Xpert SA Assay (FDA approved for NASAL specimens in patients 38 years of age and older), is one component of a comprehensive surveillance program. It is not intended to diagnose infection nor to guide or monitor treatment. Performed at [facility], [address] [place], [place] [address]     Labs: CBC: Recent Labs  Lab 08/10/24 1545 08/11/24 0427 08/12/24 0351 08/13/24 0441 08/15/24 0455  WBC 10.6* 8.8 7.4 7.1 7.2  NEUTROABS 8.4*  --   --   --   --   HGB 13.4 12.9 12.6 11.5* 11.1*  HCT 39.1 37.5 36.4 33.4* 32.6*  MCV 92.2 91.5 91.0 91.0 91.1  PLT 142* 125* 106* 96* 104*   Basic Metabolic Panel: Recent Labs  Lab 08/10/24 1545 08/11/24 0427 08/12/24 0351 08/13/24 0441 08/15/24 0455  NA 131* 130* 128* 131* 135  K 4.3 4.5 3.7 3.9 3.3*  CL 96* 95* 94* 98 100  CO2 26 27 24 26 25   GLUCOSE 113* 120* 86 119* 106*  BUN 16 13 9 9 9   CREATININE 0.80 0.71 0.59 0.59 0.52  CALCIUM 9.2 8.7* 8.2* 8.0* 8.2*  MG  --   --  2.2 2.0  --    Liver Function Tests: Recent Labs  Lab 08/10/24 1545  AST 44*  ALT 49*  ALKPHOS 60  BILITOT 0.7  PROT 6.5  ALBUMIN 4.4   CBG: No results for input(s): GLUCAP in the last 168 hours.  Discharge time spent: greater than 30 minutes.  Signed: Ivan Vangie Pike, MD [facility] 08/15/2024

## 2024-08-15 NOTE — Plan of Care (Signed)

## 2024-08-15 NOTE — TOC Transition Note (Signed)
 Transition of Care [facility]) - Discharge Note   Patient Details  Name: Nancy Avila MRN: [id number] Date of Birth: February 27, 1941  [facility] of [facility]) CM/SW Contact:  Hoy DELENA Bigness, LCSW Phone Number: 08/15/2024, 12:31 PM   Clinical Narrative:    Pt is to discharge to [facility] room 30. RN to call report to [phone]. Pt and family informed of discharge.  Family would like to use [facility] transport vs EMS. [facility] called for transport at 1:58pm.    Final next level of care: Skilled Nursing Facility Barriers to Discharge: Barriers Resolved   Patient Goals and CMS Choice Patient states their goals for this hospitalization and ongoing recovery are:: go to SNF CMS Medicare.gov Compare Post Acute Care list provided to:: Patient Choice offered to / list presented to : Patient      Discharge Placement PASRR number recieved: 08/13/24            Patient chooses bed at: [facility] Patient to be transferred to facility by: [facility] Name of family member notified: Patient Patient and family notified of of transfer: 08/15/24  Discharge Plan and Services Additional resources added to the After Visit Summary for   In-house Referral: Clinical Social Work Discharge Planning Services: CM Consult Post Acute Care Choice: Skilled Nursing Facility          DME Arranged: N/A DME Agency: NA                  Social Drivers of Health (SDOH) Interventions SDOH Screenings   Food Insecurity: No Food Insecurity (08/10/2024)  Housing: Low Risk  (08/10/2024)  Transportation Needs: No Transportation Needs (08/10/2024)  Utilities: Not At Risk (08/10/2024)  Social Connections: Moderately Integrated (08/10/2024)  Tobacco Use: Medium Risk (08/10/2024)     Readmission Risk Interventions    08/15/2024   11:58 AM 08/13/2024   11:28 AM 08/11/2024    9:51 AM  Readmission Risk Prevention Plan  Medication Screening   Complete  Transportation Screening Complete Complete  Complete  PCP or Specialist Appt within 5-7 Days Complete    Home Care Screening Complete Complete   Medication Review (RN CM) Complete Complete

## 2024-08-15 NOTE — Plan of Care (Signed)
  Problem: Education: Goal: Knowledge of General Education information will improve Description: Including pain rating scale, medication(s)/side effects and non-pharmacologic comfort measures Outcome: Progressing   Problem: Clinical Measurements: Goal: Ability to maintain clinical measurements within normal limits will improve Outcome: Progressing Goal: Will remain free from infection Outcome: Progressing Goal: Diagnostic test results will improve Outcome: Progressing Goal: Respiratory complications will improve Outcome: Progressing Goal: Cardiovascular complication will be avoided Outcome: Progressing   Problem: Elimination: Goal: Will not experience complications related to urinary retention Outcome: Progressing   Problem: Pain Managment: Goal: General experience of comfort will improve and/or be controlled Outcome: Progressing   Problem: Safety: Goal: Ability to remain free from injury will improve Outcome: Progressing

## 2024-08-15 NOTE — Progress Notes (Signed)
 Per tele patient reverted from sinus rhythm to AFIB, Dr chatterjee notified, no new orders at this time. No new orders obtained

## 2024-08-15 NOTE — Progress Notes (Signed)
 Physical Therapy Treatment Patient Details Name: [name] MRN: [id number] DOB: [birth year] Today's Date: 08/15/2024   History of Present Illness Nancy Avila is a 83 y.o. female with medical history significant of COPD, atrial fibrillation on anticoagulation, hyperglycemia.  Patient brought in via EMS due to fall at home after trying to move the chair.  This was a mechanical fall.  She did hit her head and her left hip.  She had immediate pain and was unable to move.  Here, xray shows left hip fracture.  She is on Eliquis  for atrial fibrillation and took her last dose this morning. Status post HEMIARTHROPLASTY (BIPOLAR) HIP, lateral  APPROACH FOR FRACTURE (Left).    PT Comments  Patient agreeable to PT treatment. Patient's daughters and grand daughter are present throughout session. Patient was received sitting in chair eating upon arrival. Required on CGA/supervision for STS from recliner, and ambulated ~35 feet in room and hallway with RW and CGA. Distance limited due to increased pain and faituge. Pt requested to use restroom. Used BSC in restroom. Independent with pericare in sitting and cont CGA with RW for STS from [facility]. Pt requests to return to bed at end of session due to fatigue and pain level. Pt given printed HEP of hip ROM/mobility activities and glute and quad strengthening exercises to perform in supine or sitting. Performed a few reps of each and reported understanding of all. Bed alarm set and all needs met at end of session. Nursing staff made aware of pt requesting pain meds. Patient will benefit from continued skilled physical therapy acutely and in recommended level in order to address current deficits and improve overall function.     If plan is discharge home, recommend the following: A lot of help with bathing/dressing/bathroom;A lot of help with walking and/or transfers;Help with stairs or ramp for entrance;Assist for transportation;Assistance with cooking/housework    Can travel by private vehicle        Equipment Recommendations  None recommended by PT    Recommendations for Other Services       Precautions / Restrictions Precautions Precautions: Fall Recall of Precautions/Restrictions: Intact Restrictions Weight Bearing Restrictions Per Provider Order: Yes LLE Weight Bearing Per Provider Order: Weight bearing as tolerated Other Position/Activity Restrictions: LLE     Mobility  Bed Mobility               General bed mobility comments: pt received sitting in chair upon therapist arrival    Transfers Overall transfer level: Needs assistance Equipment used: Rolling walker (2 wheels) Transfers: Sit to/from Stand Sit to Stand: Contact guard assist, Supervision           General transfer comment: STS from recliner and BSC this date, CGA during each for safety as pt reports increased discomfort with weight bearing activities. mild unsteadiness throughout. Pt demonstrates good recollection for hand and foot placement during STS    Ambulation/Gait Ambulation/Gait assistance: Contact guard assist Gait Distance (Feet): 35 Feet Assistive device: Rolling walker (2 wheels) Gait Pattern/deviations: Decreased step length - right, Decreased step length - left, Decreased stride length, Decreased stance time - left, Antalgic, Decreased weight shift to left Gait velocity: slow     General Gait Details: pt cont to demonstraate Step to pattern with RW, and limited LLE weight bearing. no overt LOB but mild unsteadiness of note. Pt takes quick standing rest breaks throughout. reports some wrist pain using standard size RW this date.   Stairs  Wheelchair Mobility     Tilt Bed    Modified Rankin (Stroke Patients Only)       Balance Overall balance assessment: Needs assistance Sitting-balance support: Feet supported, No upper extremity supported Sitting balance-Leahy Scale: Good Sitting balance - Comments: seated  at EOB   Standing balance support: Bilateral upper extremity supported, During functional activity, Reliant on assistive device for balance Standing balance-Leahy Scale: Fair Standing balance comment: using RW                            Communication Communication Communication: No apparent difficulties  Cognition Arousal: Alert Behavior During Therapy: WFL for tasks assessed/performed   PT - Cognitive impairments: No apparent impairments                         Following commands: Intact      Cueing Cueing Techniques: Verbal cues, Tactile cues, Other (comments) (Hand out)  Exercises General Exercises - Lower Extremity Ankle Circles/Pumps: AROM, Strengthening, Both, Supine, 5 reps Quad Sets: AROM, Strengthening, 5 reps, Supine, Left Gluteal Sets: AROM, Strengthening, Both, 5 reps, Supine Short Arc Quad: AROM, Strengthening, 5 reps, Supine, Left Heel Slides: AROM, Strengthening, Left, 5 reps, Supine    General Comments        Pertinent Vitals/Pain Pain Assessment Pain Assessment: Faces Faces Pain Scale: Hurts whole lot Pain Location: Initially dull pain at rest, increased L hip pain with weight bearing Pain Descriptors / Indicators: Aching Pain Intervention(s): Limited activity within patient's tolerance, Repositioned, Monitored during session    Home Living                          Prior Function            PT Goals (current goals can now be found in the care plan section) Acute Rehab PT Goals Patient Stated Goal: return home after rehab PT Goal Formulation: With patient/family Time For Goal Achievement: 08/27/24 Potential to Achieve Goals: Good Progress towards PT goals: Progressing toward goals    Frequency    Min 3X/week      PT Plan      Co-evaluation              AM-PAC PT 6 Clicks Mobility   Outcome Measure  Help needed turning from your back to your side while in a flat bed without using bedrails?: A  Little Help needed moving from lying on your back to sitting on the side of a flat bed without using bedrails?: A Little Help needed moving to and from a bed to a chair (including a wheelchair)?: A Little Help needed standing up from a chair using your arms (e.g., wheelchair or bedside chair)?: A Little Help needed to walk in hospital room?: A Little Help needed climbing 3-5 steps with a railing? : A Lot 6 Click Score: 17    End of Session Equipment Utilized During Treatment: Gait belt Activity Tolerance: Patient tolerated treatment well;Patient limited by fatigue;Patient limited by pain Patient left: in bed;with call bell/phone within reach;with family/visitor present;with bed alarm set Nurse Communication: Patient requests pain meds PT Visit Diagnosis: Unsteadiness on feet (R26.81);Other abnormalities of gait and mobility (R26.89);Muscle weakness (generalized) (M62.81)     Time: 8991-8963 PT Time Calculation (min) (ACUTE ONLY): 28 min  Charges:    $Therapeutic Exercise: 8-22 mins $Therapeutic Activity: 8-22 mins PT General Charges $$ ACUTE PT VISIT:  1 Visit                     12:55 PM, 08/15/24 Rosaria Settler, PT, DPT [place] with [facility]

## 2024-08-16 ENCOUNTER — Telehealth: Payer: Self-pay | Admitting: Orthopedic Surgery

## 2024-08-16 NOTE — Telephone Encounter (Signed)
 Faxed the op note To them  Thanks

## 2024-08-16 NOTE — Telephone Encounter (Signed)
 Dr. Areatha pt - pt is at [facility] and they need an order faxed to [phone] to remove the staples at 12-14 days.

## 2024-08-20 ENCOUNTER — Telehealth: Payer: Self-pay | Admitting: Orthopedic Surgery

## 2024-08-20 NOTE — Telephone Encounter (Signed)
 Dr. Areatha pt - [place] a nurse w/[facility] [phone] lvm stating she saw this pt yesterday, she stated the pt was at [facility], but something happened there and the pt is now at home.  The family requested nursing.  She is requesting nursing 1w4 to help the family monitor her surgical incision to the left hip that has staples.  She stated it's draining.  She stated that the family stated that the facility changed the dressing once while she was there 08/16/24 because it was soiled, and she changed it on the 08/19/24.   She stated they want a nurse there to monitor in-between PT visits.

## 2024-08-22 ENCOUNTER — Telehealth: Payer: Self-pay

## 2024-08-22 NOTE — Telephone Encounter (Signed)
 Benji w/ [facility] left message saying something about PT  3 TIMES FOR 2 WEEKS 3 TIMES FOR 1 WEEK This was a very bad connection for this message   Please call him at [phone]

## 2024-08-22 NOTE — Telephone Encounter (Signed)
 Thanks I called to give verbal orders  That's what he needed

## 2024-08-27 ENCOUNTER — Encounter: Payer: Self-pay | Admitting: Orthopedic Surgery

## 2024-08-27 ENCOUNTER — Ambulatory Visit: Admitting: Orthopedic Surgery

## 2024-08-27 DIAGNOSIS — G8918 Other acute postprocedural pain: Secondary | ICD-10-CM

## 2024-08-27 DIAGNOSIS — S72002D Fracture of unspecified part of neck of left femur, subsequent encounter for closed fracture with routine healing: Secondary | ICD-10-CM

## 2024-08-27 MED ORDER — OXYCODONE HCL 5 MG PO CAPS: 5.0000 mg | ORAL_CAPSULE | Freq: Four times a day (QID) | ORAL | 0 refills | Status: AC | PRN

## 2024-08-27 NOTE — Patient Instructions (Signed)
 Weight bearing as tolerated   Respect hip precautions   Progressively increase activity as tolerated

## 2024-08-27 NOTE — Progress Notes (Signed)
    08/27/2024   Chief Complaint  Patient presents with   Post-op Follow-up    Left hip 08/12/24    No diagnosis found.  What pharmacy do you use ? [facility]_________  DOI/DOS/ Date: 08/12/24  Did you get better, worse or no change (Answer below)   Improved Walking well with the walker Staples removed without difficulty

## 2024-08-27 NOTE — Progress Notes (Signed)
 POST OP VISIT   Patient: Nancy Avila           Date of Birth: 05/30/1941           MRN: [id number] Visit Date: 08/27/2024 Requested by: Debrah Josette ORN., PA-C [address],  [place] [address] PCP: Debrah Josette ORN., PA-C   Encounter Diagnoses  Name Primary?   Closed fracture of left hip with routine healing, subsequent encounter Bipolar 08/12/24    Post-operative pain Yes   PROCEDURE: Bipolar replacement left hip   Implants: Implant Name Type Inv. Item Serial No. Manufacturer Lot No. LRB No. Used Action  BALL HIP ARTICU 28 +5 - [id number] Hips BALL HIP ARTICU 28 +5   [employer]PU[employer] [id number] Left 1 Implanted  STEM FEM ACTIS STD SZ4 - [id number] Stem STEM FEM ACTIS STD SZ4   DEPUY [employer]TH[employer] [id number] Left 1 Implanted  HEAD BIPOLAR DEPUY 51 - [id number] Hips HEAD BIPOLAR DEPUY 51   DEPUY ORTH[employer] [id number] Left 1 Implanted     Chief Complaint  Patient presents with   Post-op Follow-up    Left hip 08/12/24    Allergies  Allergen Reactions   Codeine Other (See Comments)    Gave pt nightmares   Nyquil Cough Dm + Congestion [Phenylephrine-Doxylamine-Dm] Other (See Comments)    Made pt feel crazy   Phenylephrine Other (See Comments)    Made pt feel crazy      Current Outpatient Medications:    acetaminophen  (TYLENOL ) 500 MG tablet, Take 2 tablets (1,000 mg total) by mouth every 8 (eight) hours., Disp: , Rfl:    albuterol  (VENTOLIN  HFA) 108 (90 Base) MCG/ACT inhaler, Inhale 1-2 puffs into the lungs every 6 (six) hours as needed for wheezing or shortness of breath., Disp: , Rfl:    amiodarone  (PACERONE ) 200 MG tablet, Take 1 tablet (200 mg total) by mouth daily., Disp: , Rfl:    apixaban  (ELIQUIS ) 5 MG TABS tablet, Take 1 tablet (5 mg total) by mouth 2 (two) times daily., Disp: 60 tablet, Rfl: 5   Ascorbic Acid (VITAMIN C) 1000 MG tablet, Take 1,000 mg by mouth daily., Disp: , Rfl:    chlorhexidine  (HIBICLENS ) 4 % external liquid, Apply 15  mLs (1 Application total) topically as directed for 30 doses. Use as directed daily for 5 days every other week for 6 weeks., Disp: 946 mL, Rfl: 1   Cholecalciferol (VITAMIN D3) 75 MCG (3000 UT) TABS, Take 3,000 Units by mouth daily., Disp: , Rfl:    clonazePAM  (KLONOPIN ) 0.5 MG tablet, Take 1 tablet (0.5 mg total) by mouth 2 (two) times daily as needed (anxiety)., Disp: 6 tablet, Rfl: 0   fluticasone  (FLONASE) 50 MCG/ACT nasal spray, Place 2 sprays into both nostrils daily as needed for allergies., Disp: , Rfl:    Magnesium 300 MG CAPS, Take 300 mg by mouth every evening., Disp: , Rfl:    methocarbamol  (ROBAXIN ) 500 MG tablet, Take 1 tablet (500 mg total) by mouth every 6 (six) hours as needed for muscle spasms., Disp: , Rfl:    metoprolol  succinate (TOPROL  XL) 25 MG 24 hr tablet, Take 1 tablet (25 mg total) by mouth daily. (Patient taking differently: Take 25 mg by mouth every evening.), Disp: 90 tablet, Rfl: 3   Misc Natural Products (GINSENG COMPLEX PO), Take 1 capsule by mouth daily., Disp: , Rfl:    Multiple Vitamin (MULTIVITAMIN WITH MINERALS) TABS tablet, Take 1 tablet by mouth daily., Disp: , Rfl:    Multiple  Vitamins-Minerals (HAIR SKIN NAILS PO), Take 1 capsule by mouth daily., Disp: , Rfl:    mupirocin  ointment (BACTROBAN ) 2 %, Place 1 Application into the nose 2 (two) times daily for 60 doses. Use as directed 2 times daily for 5 days every other week for 6 weeks., Disp: 60 g, Rfl: 0   Omega-3 Fatty Acids (OMEGA 3 PO), Take 2 capsules by mouth daily., Disp: , Rfl:    OVER THE COUNTER MEDICATION, Take 2 tablets by mouth daily. Memory and Brain supplement, Disp: , Rfl:    oxycodone  (OXY-IR) 5 MG capsule, Take 1 capsule (5 mg total) by mouth every 6 (six) hours as needed for up to 5 days., Disp: 20 capsule, Rfl: 0   Probiotic Product (PROBIOTIC PO), Take 1 capsule by mouth daily., Disp: , Rfl:    TRELEGY ELLIPTA 100-62.5-25 MCG/ACT AEPB, Inhale 1 puff into the lungs daily., Disp: , Rfl:     TURMERIC PO, Take 400 mg by mouth daily., Disp: , Rfl:    vitamin B-12 (CYANOCOBALAMIN) 500 MCG tablet, Take 500 mcg by mouth daily., Disp: , Rfl:    VITAMIN D-VITAMIN K PO, Take 1 capsule by mouth daily., Disp: , Rfl:    Zinc 30 MG CAPS, Take 30 mg by mouth daily., Disp: , Rfl:     PAIN MED: Oxycodone  1 every 12 hours for 2 and 24 hours DVTPREV Eliquis   Wound check staple removed no signs of infection IMAGING: No results found.   ASSESSMENT AND PLAN:  Stable doing well ambulating appropriately with a walker Home PT on Eliquis  as per preop continue postop  Discussed tapering opioids next time we will use Norco if needed  Expect tapering over 6 to 8 weeks.  I have reviewed the patient's history and given the presence of a fragility fracture, I have deemed the necessity of a osteoporosis management referral or confirmed that the patient is currently enrolled in a osteoporosis treatment program.  Per [facility] clinic policy, our goal is ensure optimal postoperative pain control with a multimodal pain management strategy. For all [facility] patients, our goal is to wean post-operative narcotic medications by 6 weeks post-operatively. If this is not possible due to utilization of pain medication prior to surgery, your [facility] doctor will support your acute post-operative pain control for the first 6 weeks postoperatively, with a plan to transition you back to your primary pain team following that. Maralee will work to ensure a therapist, occupational.

## 2024-09-07 ENCOUNTER — Encounter: Admitting: Orthopedic Surgery

## 2024-09-10 ENCOUNTER — Encounter: Admitting: Orthopedic Surgery

## 2024-09-14 ENCOUNTER — Encounter (HOSPITAL_COMMUNITY): Payer: Self-pay | Admitting: Internal Medicine

## 2024-09-14 ENCOUNTER — Ambulatory Visit (HOSPITAL_COMMUNITY)
Admission: RE | Admit: 2024-09-14 | Discharge: 2024-09-14 | Disposition: A | Discharge status: Home / Self Care | Payer: PRIVATE HEALTH INSURANCE | Source: Ambulatory Visit | Attending: Internal Medicine | Admitting: Internal Medicine

## 2024-09-14 VITALS — BP 118/78 | HR 52 | Ht 68.0 in | Wt 180.8 lb

## 2024-09-14 DIAGNOSIS — Z79899 Other long term (current) drug therapy: Secondary | ICD-10-CM | POA: Diagnosis not present

## 2024-09-14 DIAGNOSIS — Z5181 Encounter for therapeutic drug level monitoring: Secondary | ICD-10-CM | POA: Diagnosis not present

## 2024-09-14 DIAGNOSIS — D6869 Other thrombophilia: Secondary | ICD-10-CM

## 2024-09-14 DIAGNOSIS — I4819 Other persistent atrial fibrillation: Secondary | ICD-10-CM

## 2024-09-14 NOTE — Patient Instructions (Signed)
 Please take amiodarone  in the morning and metoprolol  in the evening.  Please call us  with an update in 2 weeks to see if you have had any more episodes of dizziness.

## 2024-09-14 NOTE — Progress Notes (Signed)
 "   Primary Care Physician: Debrah Josette ORN., PA-C Primary Cardiologist: Lonni Cash, MD Electrophysiologist: None     Referring Physician: Dr. Cash Erminio SAUNDERS Hammond is a 83 y.o. female with a history of anxiety, COPD, former tobacco use, and paroxsymal atrial fibrillation who presents for consultation in the [facility]. Seen by Dr. Cash on 09/07/23 for new Afib noted by PCP. Toprol  increased to 25 mg daily. Patient is on Eliquis  5 mg BID for a CHADS2VASC score of 3.  On evaluation today, she is currently in Afib. She was unable to tolerate Toprol  25 mg daily due to hypotension and not feeling well. She has remained on Toprol  12.5 mg daily. No missed doses of Eliquis ; she took 2 doses on 12/12. She maybe feels more SOB but not sure if it's Afib or her COPD.    On follow up 11/24/23, she is currently in Afib. S/p successful DCCV on 11/01/23. No missed doses of Eliquis  5 mg BID. She felt good after cardioversion until a few days ago.   On follow up 12/15/23, she is currently in NSR. She began amiodarone  load of 200 mg BID x 4 weeks at last visit due to ERAF. She overall feels better but does note at times to feel tired. No missed doses of Eliquis . She is taking Toprol  12.5 mg.   On follow up 03/15/24, she is currently in NSR. She is here for amiodarone  surveillance. She has had no Afib burden since last office visit. No missed doses of Eliquis .  Follow-up 09/14/2024 for amiodarone  surveillance.  Patient is currently in NSR.  She has had overall very low A-fib burden since last office visit.  She is taking amiodarone  200 mg daily.  No bleeding issues on Eliquis .  Patient notes that yesterday she was dizzy while standing for an extended period of time wrapping Christmas presents.  She notes to had not had much water yesterday.  She normally drinks a lot of water every day.  This is her first episode of dizziness and she cannot recall the last time she  had this occur.  Today, she denies symptoms of palpitations, chest pain, orthopnea, PND, lower extremity edema, dizziness, presyncope, syncope, snoring, daytime somnolence, bleeding, or neurologic sequela. The patient is tolerating medications without difficulties and is otherwise without complaint today.    she has a BMI of Body mass index is 27.49 kg/m.SABRA Filed Weights   09/14/24 1110  Weight: 82 kg     Current Outpatient Medications  Medication Sig Dispense Refill   acetaminophen  (TYLENOL ) 500 MG tablet Take 2 tablets (1,000 mg total) by mouth every 8 (eight) hours.     albuterol  (VENTOLIN  HFA) 108 (90 Base) MCG/ACT inhaler Inhale 1-2 puffs into the lungs every 6 (six) hours as needed for wheezing or shortness of breath.     amiodarone  (PACERONE ) 200 MG tablet Take 1 tablet (200 mg total) by mouth daily.     apixaban  (ELIQUIS ) 5 MG TABS tablet Take 1 tablet (5 mg total) by mouth 2 (two) times daily. 60 tablet 5   Ascorbic Acid (VITAMIN C) 1000 MG tablet Take 1,000 mg by mouth daily.     chlorhexidine  (HIBICLENS ) 4 % external liquid Apply 15 mLs (1 Application total) topically as directed for 30 doses. Use as directed daily for 5 days every other week for 6 weeks. 946 mL 1   Cholecalciferol (VITAMIN D3) 75 MCG (3000 UT) TABS Take 3,000 Units by mouth daily.  clonazePAM  (KLONOPIN ) 0.5 MG tablet Take 1 tablet (0.5 mg total) by mouth 2 (two) times daily as needed (anxiety). 6 tablet 0   fluticasone  (FLONASE) 50 MCG/ACT nasal spray Place 2 sprays into both nostrils daily as needed for allergies.     Magnesium 300 MG CAPS Take 300 mg by mouth every evening.     methocarbamol  (ROBAXIN ) 500 MG tablet Take 1 tablet (500 mg total) by mouth every 6 (six) hours as needed for muscle spasms.     metoprolol  succinate (TOPROL  XL) 25 MG 24 hr tablet Take 1 tablet (25 mg total) by mouth daily. (Patient taking differently: Take 25 mg by mouth every evening.) 90 tablet 3   Misc Natural Products (GINSENG  COMPLEX PO) Take 1 capsule by mouth daily.     Multiple Vitamin (MULTIVITAMIN WITH MINERALS) TABS tablet Take 1 tablet by mouth daily.     Multiple Vitamins-Minerals (HAIR SKIN NAILS PO) Take 1 capsule by mouth daily.     Omega-3 Fatty Acids (OMEGA 3 PO) Take 2 capsules by mouth daily.     OVER THE COUNTER MEDICATION Take 2 tablets by mouth daily. Memory and Brain supplement     Probiotic Product (PROBIOTIC PO) Take 1 capsule by mouth daily.     TRELEGY ELLIPTA 100-62.5-25 MCG/ACT AEPB Inhale 1 puff into the lungs daily.     TURMERIC PO Take 400 mg by mouth daily.     vitamin B-12 (CYANOCOBALAMIN) 500 MCG tablet Take 500 mcg by mouth daily.     VITAMIN D-VITAMIN K PO Take 1 capsule by mouth daily.     Zinc 30 MG CAPS Take 30 mg by mouth daily.     No current facility-administered medications for this encounter.    Atrial Fibrillation Management history:  Previous antiarrhythmic drugs: amiodarone  Previous cardioversions: 11/01/23 Previous ablations: none Anticoagulation history: Eliquis    ROS- All systems are reviewed and negative except as per the HPI above.  Physical Exam: BP 118/78   Pulse (!) 52   Ht 5' 8 (1.727 m)   Wt 82 kg   BMI 27.49 kg/m   GEN- The patient is well appearing, alert and oriented x 3 today.   Neck - no JVD or carotid bruit noted Lungs- Clear to ausculation bilaterally, normal work of breathing Heart- Regular rate and rhythm, no murmurs, rubs or gallops, PMI not laterally displaced Extremities- no clubbing, cyanosis, or edema Skin - no rash or ecchymosis noted   EKG today demonstrates  EKG Interpretation Date/Time:  Friday September 14 2024 11:13:54 EST Ventricular Rate:  52 PR Interval:  202 QRS Duration:  94 QT Interval:  452 QTC Calculation: 420 R Axis:   -19  Text Interpretation: Sinus bradycardia When compared with ECG of 15-Mar-2024 09:06, No significant change was found Confirmed by Terra Pac ([id number]) on 09/14/2024 11:15:37 AM    Echo  10/17/23 demonstrated  1. Left ventricular ejection fraction, by estimation, is 60 to 65%. The  left ventricle has normal function. The left ventricle has no regional  wall motion abnormalities. Left ventricular diastolic function could not  be evaluated.   2. Right ventricular systolic function is normal. The right ventricular  size is normal. There is normal pulmonary artery systolic pressure.   3. Left atrial size was moderately dilated.   4. Right atrial size was mildly dilated.   5. The mitral valve is normal in structure. Moderate mitral valve  regurgitation likely related to atrial dilation. No evidence of mitral  stenosis.  6. The aortic valve is normal in structure. Aortic valve regurgitation is  mild. No aortic stenosis is present.   7. Aortic dilatation noted. There is borderline dilatation of the  ascending aorta, measuring 39 mm.   8. The inferior vena cava is normal in size with greater than 50%  respiratory variability, suggesting right atrial pressure of 3 mmHg.    ASSESSMENT & PLAN CHA2DS2-VASc Score = 3  The patient's score is based upon: CHF History: 0 HTN History: 0 Diabetes History: 0 Stroke History: 0 Vascular Disease History: 0 Age Score: 2 Gender Score: 1       ASSESSMENT AND PLAN: Persistent Atrial Fibrillation (ICD10:  I48.19) The patient's CHA2DS2-VASc score is 3, indicating a 3.2% annual risk of stroke.   S/p successful DCCV on 11/01/23.  Patient is currently in NSR.  She is overall happy with current management and we will not make any changes at this time.  I recommended to patient to take her amiodarone  in the morning and her metoprolol  12.5 mg tablet in the evening.  She will call us  with an update in a couple of weeks to ensure she is not having frequent episodes of dizziness.  If she does have increased episodes of dizziness, will recommend to discontinue metoprolol  altogether.  High risk medication monitoring (ICD10: [id number]) Patient requires  ongoing monitoring for anti-arrhythmic medication which has the potential to cause life threatening arrhythmias or AV block. Qtc stable. Continue amiodarone  200 mg daily. CMET and TSH drawn today.  Secondary Hypercoagulable State (ICD10:  D68.69) The patient is at significant risk for stroke/thromboembolism based upon her CHA2DS2-VASc Score of 3.  Continue Apixaban  (Eliquis ).  No bleeding issues on Eliquis .  Continue Eliquis  5 mg twice daily.  Dosage is correct based on weight greater than 60 kg and creatinine less than 1.5.    Follow up 6 months for amiodarone  surveillance.    Terra Pac, PA-C  Afib Clinic [facility] [address] [place], [place] [address] [phone]  "

## 2024-09-15 LAB — COMPREHENSIVE METABOLIC PANEL WITH GFR
ALT: 25 IU/L (ref 0–32)
AST: 26 IU/L (ref 0–40)
Albumin: 4.2 g/dL (ref 3.7–4.7)
Alkaline Phosphatase: 76 IU/L (ref 48–129)
BUN/Creatinine Ratio: 13 (ref 12–28)
BUN: 13 mg/dL (ref 8–27)
Bilirubin Total: 0.4 mg/dL (ref 0.0–1.2)
CO2: 24 mmol/L (ref 20–29)
Calcium: 9.6 mg/dL (ref 8.7–10.3)
Chloride: 102 mmol/L (ref 96–106)
Creatinine, Ser: 0.98 mg/dL (ref 0.57–1.00)
Globulin, Total: 1.9 g/dL (ref 1.5–4.5)
Glucose: 89 mg/dL (ref 70–99)
Potassium: 4.5 mmol/L (ref 3.5–5.2)
Sodium: 140 mmol/L (ref 134–144)
Total Protein: 6.1 g/dL (ref 6.0–8.5)
eGFR: 58 mL/min/1.73 — ABNORMAL LOW

## 2024-09-15 LAB — TSH: TSH: 0.537 u[IU]/mL (ref 0.450–4.500)

## 2024-09-17 ENCOUNTER — Ambulatory Visit (HOSPITAL_COMMUNITY): Payer: Self-pay | Admitting: Internal Medicine

## 2024-09-23 ENCOUNTER — Other Ambulatory Visit: Payer: Self-pay | Admitting: Cardiovascular Disease

## 2024-09-24 NOTE — Telephone Encounter (Signed)
 Prescription refill request for Eliquis  received. Indication: AF Last office visit: 09/14/24  JINNY Heinrich PA-C Scr: 0.98 on 09/14/24  Epic Age: 83 Weight: 82kg  Based on above findings Eliquis  5mg  twice daily is the appropriate dose.  Refill approved.

## 2024-10-01 ENCOUNTER — Telehealth (HOSPITAL_COMMUNITY): Payer: Self-pay | Admitting: *Deleted

## 2024-10-01 NOTE — Telephone Encounter (Signed)
 Patient called to update she has had no more episodes of dizziness since her recent visit. She was asked to call with an update after 2 weeks. Pt will call if issues arise.

## 2024-10-17 ENCOUNTER — Telehealth: Payer: Self-pay | Admitting: Emergency Medicine

## 2024-10-17 DIAGNOSIS — Q209 Congenital malformation of cardiac chambers and connections, unspecified: Secondary | ICD-10-CM

## 2024-10-17 DIAGNOSIS — I4891 Unspecified atrial fibrillation: Secondary | ICD-10-CM

## 2024-10-17 NOTE — Telephone Encounter (Signed)
 Reordered echo d/t expiring before pt able to schedule appt

## 2024-10-23 ENCOUNTER — Ambulatory Visit (HOSPITAL_COMMUNITY): Payer: PRIVATE HEALTH INSURANCE

## 2024-10-29 ENCOUNTER — Encounter: Admitting: Orthopedic Surgery

## 2024-11-05 ENCOUNTER — Encounter: Admitting: Orthopedic Surgery

## 2024-11-05 DIAGNOSIS — S72002D Fracture of unspecified part of neck of left femur, subsequent encounter for closed fracture with routine healing: Secondary | ICD-10-CM

## 2024-11-20 ENCOUNTER — Ambulatory Visit (HOSPITAL_COMMUNITY)

## 2025-03-15 ENCOUNTER — Ambulatory Visit (HOSPITAL_COMMUNITY): Admitting: Internal Medicine
